# Patient Record
Sex: Female | Born: 1960 | Race: White | Hispanic: No | Marital: Married | Smoking: Never smoker
Health system: Southern US, Community
[De-identification: ages and names within clinical notes are randomized; demographics above are authoritative.]

## PROBLEM LIST (undated history)

## (undated) ENCOUNTER — Ambulatory Visit: Admission: EM | Disposition: A | Discharge status: Home / Self Care | Payer: 59

## (undated) ENCOUNTER — Ambulatory Visit

## (undated) DIAGNOSIS — R112 Nausea with vomiting, unspecified: Secondary | ICD-10-CM

## (undated) DIAGNOSIS — Z87442 Personal history of urinary calculi: Secondary | ICD-10-CM

## (undated) DIAGNOSIS — Z9889 Other specified postprocedural states: Secondary | ICD-10-CM

## (undated) DIAGNOSIS — R7303 Prediabetes: Secondary | ICD-10-CM

## (undated) DIAGNOSIS — T884XXA Failed or difficult intubation, initial encounter: Secondary | ICD-10-CM

## (undated) HISTORY — PX: DIAGNOSTIC LAPAROSCOPY: SUR761

---

## 1988-11-02 HISTORY — PX: NASAL SEPTUM SURGERY: SHX37

## 1998-06-26 ENCOUNTER — Other Ambulatory Visit: Admission: RE | Admit: 1998-06-26 | Discharge: 1998-06-26 | Payer: Self-pay | Admitting: Obstetrics and Gynecology

## 1999-06-18 ENCOUNTER — Other Ambulatory Visit: Admission: RE | Admit: 1999-06-18 | Discharge: 1999-06-18 | Payer: Self-pay | Admitting: Obstetrics and Gynecology

## 2000-07-28 ENCOUNTER — Other Ambulatory Visit: Admission: RE | Admit: 2000-07-28 | Discharge: 2000-07-28 | Payer: Self-pay | Admitting: Obstetrics and Gynecology

## 2001-08-30 ENCOUNTER — Other Ambulatory Visit: Admission: RE | Admit: 2001-08-30 | Discharge: 2001-08-30 | Payer: Self-pay | Admitting: Obstetrics and Gynecology

## 2002-11-01 ENCOUNTER — Other Ambulatory Visit: Admission: RE | Admit: 2002-11-01 | Discharge: 2002-11-01 | Payer: Self-pay | Admitting: Obstetrics and Gynecology

## 2003-11-26 ENCOUNTER — Other Ambulatory Visit: Admission: RE | Admit: 2003-11-26 | Discharge: 2003-11-26 | Payer: Self-pay | Admitting: Obstetrics and Gynecology

## 2005-02-04 ENCOUNTER — Other Ambulatory Visit: Admission: RE | Admit: 2005-02-04 | Discharge: 2005-02-04 | Payer: Self-pay | Admitting: Obstetrics and Gynecology

## 2006-02-25 ENCOUNTER — Other Ambulatory Visit: Admission: RE | Admit: 2006-02-25 | Discharge: 2006-02-25 | Payer: Self-pay | Admitting: Obstetrics and Gynecology

## 2008-11-02 DIAGNOSIS — T884XXA Failed or difficult intubation, initial encounter: Secondary | ICD-10-CM

## 2008-11-02 HISTORY — DX: Failed or difficult intubation, initial encounter: T88.4XXA

## 2008-11-02 HISTORY — PX: ABDOMINAL HYSTERECTOMY: SHX81

## 2009-01-14 ENCOUNTER — Encounter (INDEPENDENT_AMBULATORY_CARE_PROVIDER_SITE_OTHER): Payer: Self-pay | Admitting: Obstetrics and Gynecology

## 2009-01-14 ENCOUNTER — Inpatient Hospital Stay (HOSPITAL_COMMUNITY): Admission: RE | Admit: 2009-01-14 | Discharge: 2009-01-16 | Payer: Self-pay | Admitting: Obstetrics and Gynecology

## 2011-02-12 LAB — CBC
Hemoglobin: 10.9 g/dL — ABNORMAL LOW (ref 12.0–15.0)
Hemoglobin: 14 g/dL (ref 12.0–15.0)
MCHC: 34.9 g/dL (ref 30.0–36.0)
Platelets: 275 10*3/uL (ref 150–400)
RBC: 4.63 MIL/uL (ref 3.87–5.11)
RDW: 12.3 % (ref 11.5–15.5)
WBC: 8.8 10*3/uL (ref 4.0–10.5)

## 2011-02-12 LAB — HCG, SERUM, QUALITATIVE: Preg, Serum: NEGATIVE

## 2011-03-17 NOTE — Discharge Summary (Signed)
NAMEBAYLOR, TEEGARDEN NO.:  [id number]   MEDICAL RECORD NO.:  [id number]          PATIENT TYPE:  INP   LOCATION:  9302                          FACILITY:  [facility]   PHYSICIAN:  Juluis Mire, M.D.   DATE OF BIRTH:  Mar 25, 1961   DATE OF ADMISSION:  01/14/2009  DATE OF DISCHARGE:  01/16/2009                               DISCHARGE SUMMARY   j   ADMITTING DIAGNOSES:  1. 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  2. Known severe pelvic endometriosis with adhesions.   POSTOPERATIVE DIAGNOSES:  1. 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  2. Known severe pelvic endometriosis with adhesions.   OPERATIVE PROCEDURE:  Total abdominal hysterectomy with bilateral  salpingo-oophorectomy and cystoscopy.   For complete history and physical, please see dictated note.   COURSE IN THE HOSPITAL:  The patient underwent above-noted surgery.  She  did have extensive pelvic adhesions.  Pathology was all benign.  She did  have significant adenomyosis.  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  Postop hemoglobin 10.9,  discharged home on second postop day.  At that time, she was tolerating  regular diet and ambulating without difficulty.  She had normal voiding  function.  She was afebrile with stable vital signs.  Abdomen was soft.  Bowel sounds were active.  Low-transverse incision was intact.  No  active bleeding.   In terms of complications, none were encountered during her stay in the  hospital.  The patient discharged home in stable condition.   DISPOSITION:  Routine postop instructions were given.  She is to avoid  heavy lifting, vaginal entrance, driving a car.  She is to watch for  signs of infection.  Signs of obstruction in terms of nausea, vomiting.  She will call with increasing abdominal or pelvic pain.  She will call  with any active vaginal bleeding.  Also instruction signs and symptoms  of deep venous thrombosis and  pulmonary embolus.  Discharged home on  [place].  She will come back to the office on Friday for removal of  staples.      Juluis Mire, M.D.  Electronically Signed     JSM/MEDQ  D:  01/16/2009  T:  01/16/2009  Job:  [id number]

## 2011-03-17 NOTE — Op Note (Signed)
NAMEKRISTAL, [name] NO.:  [id number]   MEDICAL RECORD NO.:  [id number]          PATIENT TYPE:  INP   LOCATION:  9302                          FACILITY:  [facility]   PHYSICIAN:  Juluis Mire, M.D.   DATE OF BIRTH:  04-Nov-1960   DATE OF PROCEDURE:  01/14/2009  DATE OF DISCHARGE:                               OPERATIVE REPORT   PREOPERATIVE DIAGNOSES:  Cystic enlargement on the left ovary.  Known  pelvic adhesions, endometriosis.   POSTOPERATIVE DIAGNOSES:  Cystic enlargement on the left ovary.  Known  pelvic adhesions, endometriosis.   PROCEDURE:  Total abdominal hysterectomy with bilateral salpingo-  oophorectomy.  Lysis of adhesions.  Cystoscopy.   SURGEON:  Juluis Mire, MD   ASSISTANT:  Duke Salvia. Marcelle Overlie, MD   ANESTHESIA:  General endotracheal.   ESTIMATED BLOOD LOSS:  500 mL.   PACKS AND DRAINS:  None.   INTRAOPERATIVE BLOOD PLACED:  None.   COMPLICATIONS:  None.   INDICATIONS:  Dictated in history and physical.   PROCEDURE:  The patient was taken to the OR, placed in supine position.  After satisfactory level of general endotracheal anesthesia was  obtained, the abdomen, perineum, vagina were prepped out with Betadine,  and draped to sterile field.  A prior low transverse skin incision was  identified and excised.  She did have a dark nevi below the incision,  this was excised and sent for pathology.  The incision was extended  through subcutaneous tissue.  Anterior rectus fascia was entered sharply  and incision fashioned laterally.  Fascia taken off the muscle  superiorly inferiorly.  Rectus muscles were separated in the midline.  Anterior peritoneum was entered sharply and incision of the perineum  extended both superiorly and inferiorly.  A O'Connor-O'Sullivan  retractor was put in place.  Bowel contents were packed superiorly out  of pelvis.  The sigmoid colon was adherent to the left pelvic sidewall  on the top of the uterus.  This was  taken down using the sharp  dissection.  The colon was then displaced out of the pelvis.  There was  a large cystic enlarged on the left side.  It was easily freed up and  looked like it was a hydrosalpinx.  There was a simple clear cyst.  It  was sent off for pathology.  At this point in time, we are using blunt  sharp dissection developed the cul-de-sac.  We then went to the right  side.  It was hard to identify any obvious structures.  We identified,  what we felt was a round ligament.  We clamped, cut, and suture ligated  it, and developed the right retroperitoneal space.  The right ovary was  small and atrophic.  It was elevated up.  We felt like we could feel the  ureter on the right side, isolated the ovarian vasculature above this,  clamped, cut it, and doubly ligated with 2 free ties of 0 Vicryl.  We  then dissected this up to the uterus and the ovary was cut away from the  uterus and sent to  pathology.  We then went to the left side, it was  hard to identify any ovary on this side, but eventually developed, found  what we felt was a small piece of the ovary, it was elevated.  Again, we  felt we could feel the ureter.  The ovarian vasculature was isolated  above this, clamped, cut, and doubly ligated with 2 free ties of 0  Vicryl.  The ovary was cut away from the uterus and sent to pathology.  We then developed the bladder flap.  Uterine vessels were clamped, cut,  and suture ligated with 0 Vicryl.  The uterus actually started shredding  at this point, it was very small, but extremely friable.  We continued  the clamp, cut, and suture ligated techniques went down the uterine  sidewall.  We got a bleeder on the left side.  This was brought to  control with several vascular clips.  We then identified what looked  like the end of the cervix, clamped across this and cut it, and what was  left at the cervical stump was passed off the operative field.  Again,  the uterus came out in  small pieces.  We then identified the vaginal  angles.  These were ligated with suture ligatures of 0 Vicryl.  Vaginal  mucosa was closed with interrupted figure-of-eight of 0 Vicryl.  We had  good hemostasis.  We irrigated the pelvis.  There was no active  bleeding.  We looked for the appendix could not find it.  Then, we  irrigated had good hemostasis.  At this point in time, packs removed  along with a O'Connor-O'Sullivan retractor again we tried to find the  appendix, could not see it in the cecum.  It did not look to be  retrocecal either.  Perineum muscle closed with a suture of 2-0 Vicryl.  Fascia closed with a suture of 0 PDS.  Skin was closed with staples and  Steri-Strips.  At this point in time, the patient was placed in dorsal  lithotomy position.  Cystoscopy was performed.  There was no evidence of  any injury to the bladder.  Both ureteral orifices were identified noted  to be spilling large amounts of indigo carmine stained urine.  Therefore, ureters appeared to be intact.  Foley was placed back  straight drain.  The patient taken out of dorsal lithotomy position,  once alert and extubated, transferred to recovery room in good  condition.  Sponge, instrument, and needle count reported as correct by  circulating nurse x2.      Juluis Mire, M.D.  Electronically Signed     JSM/MEDQ  D:  01/14/2009  T:  01/14/2009  Job:  [id number]

## 2011-03-17 NOTE — H&P (Signed)
NAME:  [name], [name] NO.:  [id number]   MEDICAL RECORD NO.:  [id number]          PATIENT TYPE:  AMB   LOCATION:  SDC                           FACILITY:  [facility]   PHYSICIAN:  Juluis Mire, M.D.   DATE OF BIRTH:  October 05, 1961   DATE OF ADMISSION:  DATE OF DISCHARGE:                              HISTORY & PHYSICAL   The patient is a 50 year old gravida 1, para 0, abortus one, female who  presents for total abdominal hysterectomy with bilateral salpingo-  oophorectomy.   The patient known to have severe pelvic adhesions secondary to  endometriosis.  She has had persistent cystic enlargement of the left  ovary.  It measures approximately 6.8 x 4.8 is simple in nature.  No  free fluid was noted.  Because of persistent cystic enlargement and  associated pelvic pain and age, the patient now presents for definitive  surgery.   The patient's past history is significant in that in 1981, she had a  cyst removed via laparotomy.  Evidently was seen, have severe  endometriosis with adhesions at that point in time.  Because of  recurrent pelvic pain.  She underwent laparoscopic evaluation on November 20, 1993, with ovarian cystectomy that turned out to be an endometrioma.  She had conceived with in vitro fertilization, unfortunately loss of  pregnancy.  Again, she is known to have pelvic adhesions with bleeding.  This is an ovarian entrapment cyst.  We are going to proceed with  exploratory surgery.   In terms of allergies, the patient has no known drug allergies.   MEDICATIONS:  Depo-Provera.   PAST MEDICAL HISTORY:  Usual childhood diseases.  Does have a history of  kidney stones and associated urinary tract infections.   PAST SURGICAL HISTORY:  She had exploratory laparotomy with removal of  left endometrioma in 1981.  She had a hysteroscopy in 1995.  At that  time also had laparoscopic removal of cyst from the left ovary.  She had  a hysteroscopy in July 1995.  Nose  surgery in 1992.  Conization of  cervix in 1987.  Previous D and C for nonviable pregnancy in 1995.   FAMILY HISTORY:  Noncontributory.   SOCIAL HISTORY:  No tobacco or alcohol use.   REVIEW OF SYSTEMS:  Noncontributory.   PHYSICAL EXAMINATION:  VITAL SIGNS:  The patient is afebrile, stable  vital signs.  HEENT:  The patient is normocephalic.  Pupils equal, round, reactive to  light, and accommodation.  Extraocular movements were intact.  Sclerae  and conjunctivae are clear.  Oropharynx is clear.  NECK:  Without thyromegaly.  BREASTS:  No discrete masses.  LUNGS:  Clear.  CARDIOVASCULAR:  Regular rate without murmurs or gallops.  ABDOMEN:  Benign.  No mass, organomegaly, or tenderness.  PELVIC:  Normal external genitalia.  Vaginal mucosa is clear.  Cervix  unremarkable.  Uterus is somewhat fixed.  Adnexa unremarkable.  I do not  feel the cyst on pelvic exam.  EXTREMITIES:  Trace edema.  NEUROLOGIC:  Grossly normal limits.   IMPRESSION:  1. It is known pelvic  adhesions secondary to severe endometriosis.  2. Persistent cystic enlargement of the ovary.   PLAN:  The patient will undergo a total abdominal hysterectomy and  bilateral salpingo-oophorectomy.  The risks of surgery have been  discussed including the risk of infection.  The risk of hemorrhage could  require transfusion with the risk of AIDS or hepatitis.  Risk of injury  to adjacent organs including bladder, bowel, or ureters that could  require further exploratory surgery.  Risk of deep venous thrombosis and  pulmonary embolus.  The patient expressed understanding the indications  and risk.      Juluis Mire, M.D.  Electronically Signed     JSM/MEDQ  D:  01/14/2009  T:  01/14/2009  Job:  [id number]

## 2014-05-06 ENCOUNTER — Encounter (HOSPITAL_COMMUNITY): Payer: Self-pay | Admitting: Emergency Medicine

## 2014-05-06 ENCOUNTER — Emergency Department (HOSPITAL_COMMUNITY): Payer: 59

## 2014-05-06 ENCOUNTER — Emergency Department (HOSPITAL_COMMUNITY)
Admission: EM | Admit: 2014-05-06 | Discharge: 2014-05-07 | Disposition: A | Discharge status: Home / Self Care | Payer: 59 | Attending: Emergency Medicine | Admitting: Emergency Medicine

## 2014-05-06 DIAGNOSIS — Z23 Encounter for immunization: Secondary | ICD-10-CM | POA: Insufficient documentation

## 2014-05-06 DIAGNOSIS — T148XXA Other injury of unspecified body region, initial encounter: Secondary | ICD-10-CM

## 2014-05-06 DIAGNOSIS — S62650B Nondisplaced fracture of medial phalanx of right index finger, initial encounter for open fracture: Secondary | ICD-10-CM

## 2014-05-06 DIAGNOSIS — S61209A Unspecified open wound of unspecified finger without damage to nail, initial encounter: Secondary | ICD-10-CM | POA: Insufficient documentation

## 2014-05-06 DIAGNOSIS — S91009A Unspecified open wound, unspecified ankle, initial encounter: Secondary | ICD-10-CM

## 2014-05-06 DIAGNOSIS — S81009A Unspecified open wound, unspecified knee, initial encounter: Secondary | ICD-10-CM | POA: Insufficient documentation

## 2014-05-06 DIAGNOSIS — S61218A Laceration without foreign body of other finger without damage to nail, initial encounter: Secondary | ICD-10-CM

## 2014-05-06 DIAGNOSIS — IMO0002 Reserved for concepts with insufficient information to code with codable children: Secondary | ICD-10-CM | POA: Insufficient documentation

## 2014-05-06 DIAGNOSIS — Y929 Unspecified place or not applicable: Secondary | ICD-10-CM | POA: Insufficient documentation

## 2014-05-06 DIAGNOSIS — S81809A Unspecified open wound, unspecified lower leg, initial encounter: Secondary | ICD-10-CM

## 2014-05-06 DIAGNOSIS — Y9389 Activity, other specified: Secondary | ICD-10-CM | POA: Insufficient documentation

## 2014-05-06 DIAGNOSIS — S81811A Laceration without foreign body, right lower leg, initial encounter: Secondary | ICD-10-CM

## 2014-05-06 DIAGNOSIS — W540XXA Bitten by dog, initial encounter: Secondary | ICD-10-CM | POA: Insufficient documentation

## 2014-05-06 DIAGNOSIS — S61216A Laceration without foreign body of right little finger without damage to nail, initial encounter: Secondary | ICD-10-CM

## 2014-05-06 DIAGNOSIS — Z79899 Other long term (current) drug therapy: Secondary | ICD-10-CM | POA: Insufficient documentation

## 2014-05-06 NOTE — ED Notes (Signed)
Pt states she was bitten by her dog while trying to break up a dog fight.  Pt is addiment that all of her dogs are up to date on their rabies vaccinations

## 2014-05-06 NOTE — ED Notes (Signed)
The pt is c/o pain in her rt lower leg and pain in her rt index finger. From a dog bite.  The dogs are hers.  She was attempting to break up a fight and she was bitten

## 2014-05-07 ENCOUNTER — Emergency Department (HOSPITAL_COMMUNITY): Payer: 59

## 2014-05-07 MED ORDER — HYDROCODONE-ACETAMINOPHEN 5-325 MG PO TABS
2.0000 | ORAL_TABLET | Freq: Once | ORAL | Status: AC
  Administered 2014-05-07: 2 via ORAL
  Filled 2014-05-07: qty 2

## 2014-05-07 MED ORDER — TETANUS-DIPHTH-ACELL PERTUSSIS 5-2.5-18.5 LF-MCG/0.5 IM SUSP
0.5000 mL | Freq: Once | INTRAMUSCULAR | Status: AC
  Administered 2014-05-07: 0.5 mL via INTRAMUSCULAR
  Filled 2014-05-07: qty 0.5

## 2014-05-07 MED ORDER — AMOXICILLIN-POT CLAVULANATE 875-125 MG PO TABS: 1.0000 | ORAL_TABLET | Freq: Two times a day (BID) | ORAL | Status: DC

## 2014-05-07 MED ORDER — SODIUM CHLORIDE 0.9 % IV SOLN
3.0000 g | Freq: Once | INTRAVENOUS | Status: AC
  Administered 2014-05-07: 3 g via INTRAVENOUS
  Filled 2014-05-07: qty 3

## 2014-05-07 NOTE — ED Notes (Signed)
Returned from xray

## 2014-05-07 NOTE — ED Notes (Signed)
Transported to xray 

## 2014-05-07 NOTE — Discharge Instructions (Signed)
Take antibiotic for full dose  Use RICE method - see below Return to the emergency department if you develop any changing/worsening condition, drainage, spreading redness/swelling, or any other concerns (please read additional information regarding your condition below)   Animal Bite An animal bite can result in a scratch on the skin, deep open cut, puncture of the skin, crush injury, or tearing away of the skin or a body part. Dogs are responsible for most animal bites. Children are bitten more often than adults. An animal bite can range from very mild to more serious. A small bite from your house pet is no cause for alarm. However, some animal bites can become infected or injure a bone or other tissue. You must seek medical care if:  The skin is broken and bleeding does not slow down or stop after 15 minutes.  The puncture is deep and difficult to clean (such as a cat bite).  Pain, warmth, redness, or pus develops around the wound.  The bite is from a stray animal or rodent. There may be a risk of rabies infection.  The bite is from a snake, raccoon, skunk, fox, coyote, or bat. There may be a risk of rabies infection.  The person bitten has a chronic illness such as diabetes, liver disease, or cancer, or the person takes medicine that lowers the immune system.  There is concern about the location and severity of the bite. It is important to clean and protect an animal bite wound right away to prevent infection. Follow these steps:  Clean the wound with plenty of water and soap.  Apply an antibiotic cream.  Apply gentle pressure over the wound with a clean towel or gauze to slow or stop bleeding.  Elevate the affected area above the heart to help stop any bleeding.  Seek medical care. Getting medical care within 8 hours of the animal bite leads to the best possible outcome. DIAGNOSIS  Your caregiver will most likely:  Take a detailed history of the animal and the bite  injury.  Perform a wound exam.  Take your medical history. Blood tests or X-rays may be performed. Sometimes, infected bite wounds are cultured and sent to a lab to identify the infectious bacteria.  TREATMENT  Medical treatment will depend on the location and type of animal bite as well as the patient's medical history. Treatment may include:  Wound care, such as cleaning and flushing the wound with saline solution, bandaging, and elevating the affected area.  Antibiotics.  Tetanus immunization.  Rabies immunization.  Leaving the wound open to heal. This is often done with animal bites, due to the high risk of infection. However, in certain cases, wound closure with stitches, wound adhesive, skin adhesive strips, or staples may be used. Infected bites that are left untreated may require intravenous (IV) antibiotics and surgical treatment in the hospital. HOME CARE INSTRUCTIONS  Follow your caregiver's instructions for wound care.  Take all medicines as directed.  If your caregiver prescribes antibiotics, take them as directed. Finish them even if you start to feel better.  Follow up with your caregiver for further exams or immunizations as directed. You may need a tetanus shot if:  You cannot remember when you had your last tetanus shot.  You have never had a tetanus shot.  The injury broke your skin. If you get a tetanus shot, your arm may swell, get red, and feel warm to the touch. This is common and not a problem. If you need  a tetanus shot and you choose not to have one, there is a rare chance of getting tetanus. Sickness from tetanus can be serious. SEEK MEDICAL CARE IF:  You notice warmth, redness, soreness, swelling, pus discharge, or a bad smell coming from the wound.  You have a red line on the skin coming from the wound.  You have a fever, chills, or a general ill feeling.  You have nausea or vomiting.  You have continued or worsening pain.  You have  trouble moving the injured part.  You have other questions or concerns. MAKE SURE YOU:  Understand these instructions.  Will watch your condition.  Will get help right away if you are not doing well or get worse. Document Released: 07/07/2011 Document Revised: 01/11/2012 Document Reviewed: 07/07/2011 [facility] Patient Information 2015 [place], [place]. This information is not intended to replace advice given to you by your health care provider. Make sure you discuss any questions you have with your health care provider.  Laceration Care, Adult A laceration is a cut or lesion that goes through all layers of the skin and into the tissue just beneath the skin. TREATMENT  Some lacerations may not require closure. Some lacerations may not be able to be closed due to an increased risk of infection. It is important to see your caregiver as soon as possible after an injury to minimize the risk of infection and maximize the opportunity for successful closure. If closure is appropriate, pain medicines may be given, if needed. The wound will be cleaned to help prevent infection. Your caregiver will use stitches (sutures), staples, wound glue (adhesive), or skin adhesive strips to repair the laceration. These tools bring the skin edges together to allow for faster healing and a better cosmetic outcome. However, all wounds will heal with a scar. Once the wound has healed, scarring can be minimized by covering the wound with sunscreen during the day for 1 full year. HOME CARE INSTRUCTIONS  For sutures or staples:  Keep the wound clean and dry.  If you were given a bandage (dressing), you should change it at least once a day. Also, change the dressing if it becomes wet or dirty, or as directed by your caregiver.  Wash the wound with soap and water 2 times a day. Rinse the wound off with water to remove all soap. Pat the wound dry with a clean towel.  After cleaning, apply a thin layer of the antibiotic  ointment as recommended by your caregiver. This will help prevent infection and keep the dressing from sticking.  You may shower as usual after the first 24 hours. Do not soak the wound in water until the sutures are removed.  Only take over-the-counter or prescription medicines for pain, discomfort, or fever as directed by your caregiver.  Get your sutures or staples removed as directed by your caregiver. For skin adhesive strips:  Keep the wound clean and dry.  Do not get the skin adhesive strips wet. You may bathe carefully, using caution to keep the wound dry.  If the wound gets wet, pat it dry with a clean towel.  Skin adhesive strips will fall off on their own. You may trim the strips as the wound heals. Do not remove skin adhesive strips that are still stuck to the wound. They will fall off in time. For wound adhesive:  You may briefly wet your wound in the shower or bath. Do not soak or scrub the wound. Do not swim. Avoid periods of  heavy perspiration until the skin adhesive has fallen off on its own. After showering or bathing, gently pat the wound dry with a clean towel.  Do not apply liquid medicine, cream medicine, or ointment medicine to your wound while the skin adhesive is in place. This may loosen the film before your wound is healed.  If a dressing is placed over the wound, be careful not to apply tape directly over the skin adhesive. This may cause the adhesive to be pulled off before the wound is healed.  Avoid prolonged exposure to sunlight or tanning lamps while the skin adhesive is in place. Exposure to ultraviolet light in the first year will darken the scar.  The skin adhesive will usually remain in place for 5 to 10 days, then naturally fall off the skin. Do not pick at the adhesive film. You may need a tetanus shot if:  You cannot remember when you had your last tetanus shot.  You have never had a tetanus shot. If you get a tetanus shot, your arm may swell,  get red, and feel warm to the touch. This is common and not a problem. If you need a tetanus shot and you choose not to have one, there is a rare chance of getting tetanus. Sickness from tetanus can be serious. SEEK MEDICAL CARE IF:   You have redness, swelling, or increasing pain in the wound.  You see a red line that goes away from the wound.  You have yellowish-white fluid (pus) coming from the wound.  You have a fever.  You notice a bad smell coming from the wound or dressing.  Your wound breaks open before or after sutures have been removed.  You notice something coming out of the wound such as wood or glass.  Your wound is on your hand or foot and you cannot move a finger or toe. SEEK IMMEDIATE MEDICAL CARE IF:   Your pain is not controlled with prescribed medicine.  You have severe swelling around the wound causing pain and numbness or a change in color in your arm, hand, leg, or foot.  Your wound splits open and starts bleeding.  You have worsening numbness, weakness, or loss of function of any joint around or beyond the wound.  You develop painful lumps near the wound or on the skin anywhere on your body. MAKE SURE YOU:   Understand these instructions.  Will watch your condition.  Will get help right away if you are not doing well or get worse. Document Released: 10/19/2005 Document Revised: 01/11/2012 Document Reviewed: 04/14/2011 [facility] Patient Information 2015 [place], [place]. This information is not intended to replace advice given to you by your health care provider. Make sure you discuss any questions you have with your health care provider.  Finger Fracture Fractures of fingers are breaks in the bones of the fingers. There are many types of fractures. There are different ways of treating these fractures. Your health care provider will discuss the best way to treat your fracture. CAUSES Traumatic injury is the main cause of broken fingers. These  include: Injuries while playing sports. Workplace injuries. Falls. RISK FACTORS Activities that can increase your risk of finger fractures include: Sports. Workplace activities that involve machinery. A condition called osteoporosis, which can make your bones less dense and cause them to fracture more easily. SIGNS AND SYMPTOMS The main symptoms of a broken finger are pain and swelling within 15 minutes after the injury. Other symptoms include: Bruising of your finger. Stiffness  of your finger. Numbness of your finger. Exposed bones (compound fracture) if the fracture is severe. DIAGNOSIS  The best way to diagnose a broken bone is with X-ray imaging. Additionally, your health care provider will use this X-ray image to evaluate the position of the broken finger bones.  TREATMENT  Finger fractures can be treated with:  Nonreduction--This means the bones are in place. The finger is splinted without changing the positions of the bone pieces. The splint is usually left on for about a week to 10 days. This will depend on your fracture and what your health care provider thinks. Closed reduction--The bones are put back into position without using surgery. The finger is then splinted. Open reduction and internal fixation--The fracture site is opened. Then the bone pieces are fixed into place with pins or some type of hardware. This is seldom required. It depends on the severity of the fracture. HOME CARE INSTRUCTIONS  Follow your health care provider's instructions regarding activities, exercises, and physical therapy. Only take over-the-counter or prescription medicines for pain, discomfort, or fever as directed by your health care provider. SEEK MEDICAL CARE IF: You have pain or swelling that limits the motion or use of your fingers. SEEK IMMEDIATE MEDICAL CARE IF:  Your finger becomes numb. MAKE SURE YOU:  Understand these instructions. Will watch your condition. Will get help right away if  you are not doing well or get worse. Document Released: 01/31/2001 Document Revised: 08/09/2013 Document Reviewed: 05/31/2013 [facility] Patient Information 2015 [place], [place]. This information is not intended to replace advice given to you by your health care provider. Make sure you discuss any questions you have with your health care provider.  RICE: Routine Care for Injuries The routine care of many injuries includes Rest, Ice, Compression, and Elevation (RICE). HOME CARE INSTRUCTIONS Rest is needed to allow your body to heal. Routine activities can usually be resumed when comfortable. Injured tendons and bones can take up to 6 weeks to heal. Tendons are the cord-like structures that attach muscle to bone. Ice following an injury helps keep the swelling down and reduces pain. Put ice in a plastic bag. Place a towel between your skin and the bag. Leave the ice on for 15-20 minutes, 3-4 times a day, or as directed by your health care provider. Do this while awake, for the first 24 to 48 hours. After that, continue as directed by your caregiver. Compression helps keep swelling down. It also gives support and helps with discomfort. If an elastic bandage has been applied, it should be removed and reapplied every 3 to 4 hours. It should not be applied tightly, but firmly enough to keep swelling down. Watch fingers or toes for swelling, bluish discoloration, coldness, numbness, or excessive pain. If any of these problems occur, remove the bandage and reapply loosely. Contact your caregiver if these problems continue. Elevation helps reduce swelling and decreases pain. With extremities, such as the arms, hands, legs, and feet, the injured area should be placed near or above the level of the heart, if possible. SEEK IMMEDIATE MEDICAL CARE IF: You have persistent pain and swelling. You develop redness, numbness, or unexpected weakness. Your symptoms are getting worse rather than improving after several  days. These symptoms may indicate that further evaluation or further X-rays are needed. Sometimes, X-rays may not show a small broken bone (fracture) until 1 week or 10 days later. Make a follow-up appointment with your caregiver. Ask when your X-ray results will be ready. Make sure you  get your X-ray results. Document Released: 01/31/2001 Document Revised: 10/24/2013 Document Reviewed: 03/20/2011 [facility] [place] Patient Information 2015 [place], [place]. This information is not intended to replace advice given to you by your health care provider. Make sure you discuss any questions you have with your health care provider.

## 2014-05-07 NOTE — ED Notes (Signed)
Patient and family verbalized understanding of discharge instructions.  Patient with no s/sx of adverse reaction to antibiotic.  Dressing applied to finger by pa and dressing to legs reinforced prior to discharge.

## 2014-05-07 NOTE — ED Provider Notes (Signed)
CSN: [id number]     Arrival date & time 05/06/14  2142 History   First MD Initiated Contact with Patient 05/06/14 2333     Chief Complaint  Patient presents with  . Animal Bite   HPI  [name] is a 53 y.o. female with no PMH who presents to the ED for evaluation of an animal bite. History was provided by the patient. This evening, patient was trying to break up a dog fight when she was bitten in the right hand and right leg. Dog's are owned by the patient and their vaccinations are up to date (rabies). Patient states she felt a "crunch" in her index finger when her dog bit down. Complains of a constant throbbing pain to her index finger, which is worse with movement. No numbness/tingling or loss of sensation. Has some difficulty moving the tip of her index finger. Patient washed her wounds PTA. Denies any known foreign bodies. Unclear if tetanus is up to date. No difficulty with ambulation. Nothing provided for pain PTA.    History reviewed. No pertinent past medical history. History reviewed. No pertinent past surgical history. No family history on file. History  Substance Use Topics  . Smoking status: Never Smoker   . Smokeless tobacco: Not on file  . Alcohol Use: Yes   OB History   Grav Para Term Preterm Abortions TAB SAB Ect Mult Living                  Review of Systems  Constitutional: Negative for fever, chills, activity change, appetite change and fatigue.  Musculoskeletal: Positive for arthralgias (right index finger) and joint swelling (right index finger). Negative for gait problem.  Skin: Positive for wound. Negative for color change.  Neurological: Positive for weakness. Negative for numbness.    Allergies  Review of patient's allergies indicates no known allergies.  Home Medications   Prior to Admission medications   Medication Sig Start Date End Date Taking? Authorizing Provider  MAGNESIUM PO Take 1 tablet by mouth daily.   Yes Historical Provider, MD    Probiotic Product (PROBIOTIC PO) Take 1 tablet by mouth daily.   Yes Historical Provider, MD  amoxicillin-clavulanate (AUGMENTIN) 875-125 MG per tablet Take 1 tablet by mouth every 12 (twelve) hours. 05/07/14   Jillyn LedgerJessica K Reneshia Zuccaro, PA-C   BP 101/64  Pulse 66  Temp(Src) 97.6 F (36.4 C) (Oral)  Resp 20  Ht 5\' 2"  (1.575 m)  Wt 165 lb 8 oz (75.07 kg)  BMI 30.26 kg/m2  SpO2 99%  Filed Vitals:   05/06/14 2146 05/07/14 0054  BP: 140/85 101/64  Pulse: 85 66  Temp: 97.6 F (36.4 C)   TempSrc: Oral   Resp: 14 20  Height: 5\' 2"  (1.575 m)   Weight: 165 lb 8 oz (75.07 kg)   SpO2: 96% 99%    Physical Exam  Nursing note and vitals reviewed. Constitutional: She is oriented to person, place, and time. She appears well-developed and well-nourished. No distress.  HENT:  Head: Normocephalic and atraumatic.  Right Ear: External ear normal.  Left Ear: External ear normal.  Mouth/Throat: Oropharynx is clear and moist.  Eyes: Conjunctivae are normal. Right eye exhibits no discharge. Left eye exhibits no discharge.  Neck: Normal range of motion. Neck supple.  Cardiovascular: Normal rate.   Radial and dorsalis pedis pulses present and equal bilaterally  Pulmonary/Chest: Effort normal.  Abdominal: Soft.  Musculoskeletal: Normal range of motion. She exhibits edema and tenderness.  Hands:      Legs: Tenderness to palpation to the distal right index finger with mild associated edema. Two 1 cm open horizontal linear lacerations to the distal middle phalanx on the dorsal and palmar side of the right index finger. No active bleeding or drainage. ROM of the DIP joint limited on the right index finger. Full ROM of the right PIP index finger. ROM intact in the LE. Patient able to ambulate without difficulty or ataxia  Neurological: She is alert and oriented to person, place, and time.  Sensation intact in the UE and LE  Skin: Skin is warm and dry. She is not diaphoretic.  3-4 puncture wounds to the  palmar side of the right pinkie finger with no limitations in ROM. No active bleeding. 2-3 puncture wounds to the medial and lateral malleolus on the right leg with no active bleeding. 4 open 1 cm elliptical wounds to the anterior proximal lower leg with no active drainage or bleeding. No palpable foreign bodies throughout.     ED Course  Procedures (including critical care time) Labs Review Labs Reviewed - No data to display  Imaging Review Dg Tibia/fibula Right  05/07/2014   CLINICAL DATA:  Dog bite with swelling to the mid anterior tib-fib area.  EXAM: RIGHT TIBIA AND FIBULA - 2 VIEW  COMPARISON:  None.  FINDINGS: Soft tissue laceration anterior and medial to the proximal tibia. No radiopaque soft tissue foreign bodies. Underlying bones appear intact.  IMPRESSION: Soft tissue injury.  No acute bony abnormalities.   Electronically Signed   By: Burman NievesWilliam  Stevens M.D.   On: 05/07/2014 00:41   Dg Ankle Complete Right  05/07/2014   CLINICAL DATA:  Dog bite to ankle.  Ankle pain and swelling.  EXAM: RIGHT ANKLE - COMPLETE 3+ VIEW  COMPARISON:  None.  FINDINGS: There is no evidence of fracture, dislocation, or joint effusion. There is no evidence of arthropathy or other focal bone abnormality. Soft tissues are unremarkable. No evidence of radiopaque foreign body.  IMPRESSION: Negative.   Electronically Signed   By: Myles RosenthalJohn  Stahl M.D.   On: 05/07/2014 00:41   Dg Finger Index Right  05/06/2014   CLINICAL DATA:  Dog bite.  Index finger pain and swelling.  EXAM: RIGHT INDEX FINGER 2+V  COMPARISON:  None.  FINDINGS: Nondisplaced fracture of the middle phalanx of the index finger is seen. No evidence of dislocation. Soft tissue swelling and laceration noted, however there is no evidence of radiopaque foreign body.  IMPRESSION: Nondisplaced fracture of the middle phalanx. No evidence of radiopaque foreign body.   Electronically Signed   By: Myles RosenthalJohn  Stahl M.D.   On: 05/06/2014 22:43   Dg Finger Little  Right  05/07/2014   CLINICAL DATA:  Dog bite, finger swelling.  EXAM: RIGHT LITTLE FINGER 2+V  COMPARISON:  Right index finger radiograph May 06, 2014  FINDINGS: Oblique nondisplaced fracture of a middle phalanx seen only on lateral radiograph may reflect patient's known second digit fracture. The fourth and fifth digits appear intact without fracture deformity or dislocation on the remaining two views. No destructive bony lesions. Soft tissue planes are nonsuspicious.  IMPRESSION: Middle phalanx nondisplaced fracture seen on lateral radiograph may reflect patient's known second digit fracture.   Electronically Signed   By: Awilda Metroourtnay  Bloomer   On: 05/07/2014 00:42     EKG Interpretation None      MDM   [name] is a 53 y.o. female with no PMH who presents to  the ED for evaluation of an animal bite. Rabies up to date per patient. Patient found to have a non-displaced fracture of the middle phalanx on the right index finger with an overlying laceration. All wound extensively irrigated in the ED and antibiotic ointment applied. Finger splinted. No lacerations were repaired. Patient given IV Unasyn and will be discharged home on Augmentin. Tetanus booster given. Wound care discussed with patient. Hand surgery consulted and will follow patient in clinic this week. Return precautions, discharge instructions, and follow-up was discussed with the patient before discharge.   Consults  Spoke with Dr. Melvyn Novas who will follow-up with the patient in clinic this week. Unasyn IV and Augmentin OP. Splint finger.    New Prescriptions   AMOXICILLIN-CLAVULANATE (AUGMENTIN) 875-125 MG PER TABLET    Take 1 tablet by mouth every 12 (twelve) hours.     Final impressions: 1. Animal bite   2. Open nondisplaced fracture of middle phalanx of right index finger, initial encounter   3. Laceration of lower leg, right, initial encounter   4. Laceration of index finger, initial encounter   5. Laceration of fifth  finger of right hand, initial encounter       Luiz Iron PA-C   This patient was discussed with Dr. Verne Carrow, PA-C 05/07/14 [phone]

## 2014-05-07 NOTE — ED Notes (Signed)
Ortho here to place splint to the right finger

## 2014-05-08 ENCOUNTER — Encounter (HOSPITAL_COMMUNITY): Payer: Self-pay | Admitting: *Deleted

## 2014-05-09 ENCOUNTER — Ambulatory Visit (HOSPITAL_COMMUNITY)
Admission: RE | Admit: 2014-05-09 | Discharge: 2014-05-09 | Disposition: A | Discharge status: Home / Self Care | Payer: 59 | Source: Ambulatory Visit | Attending: Orthopedic Surgery | Admitting: Orthopedic Surgery

## 2014-05-09 ENCOUNTER — Encounter (HOSPITAL_COMMUNITY): Payer: Self-pay | Admitting: Anesthesiology

## 2014-05-09 ENCOUNTER — Encounter (HOSPITAL_COMMUNITY): Payer: 59 | Admitting: Anesthesiology

## 2014-05-09 ENCOUNTER — Ambulatory Visit (HOSPITAL_COMMUNITY): Payer: 59 | Admitting: Anesthesiology

## 2014-05-09 ENCOUNTER — Encounter (HOSPITAL_COMMUNITY)
Admission: RE | Disposition: A | Discharge status: Home / Self Care | Payer: Self-pay | Source: Ambulatory Visit | Attending: Orthopedic Surgery

## 2014-05-09 DIAGNOSIS — S61209A Unspecified open wound of unspecified finger without damage to nail, initial encounter: Secondary | ICD-10-CM | POA: Insufficient documentation

## 2014-05-09 DIAGNOSIS — R7309 Other abnormal glucose: Secondary | ICD-10-CM | POA: Insufficient documentation

## 2014-05-09 DIAGNOSIS — IMO0002 Reserved for concepts with insufficient information to code with codable children: Secondary | ICD-10-CM | POA: Insufficient documentation

## 2014-05-09 DIAGNOSIS — W540XXA Bitten by dog, initial encounter: Secondary | ICD-10-CM | POA: Insufficient documentation

## 2014-05-09 HISTORY — DX: Failed or difficult intubation, initial encounter: T88.4XXA

## 2014-05-09 HISTORY — DX: Personal history of urinary calculi: Z87.442

## 2014-05-09 HISTORY — DX: Nausea with vomiting, unspecified: R11.2

## 2014-05-09 HISTORY — PX: INCISION AND DRAINAGE OF WOUND: SHX1803

## 2014-05-09 HISTORY — DX: Other specified postprocedural states: Z98.890

## 2014-05-09 LAB — BASIC METABOLIC PANEL
Anion gap: 16 — ABNORMAL HIGH (ref 5–15)
BUN: 9 mg/dL (ref 6–23)
CALCIUM: 9.2 mg/dL (ref 8.4–10.5)
CO2: 24 mEq/L (ref 19–32)
Chloride: 99 mEq/L (ref 96–112)
Creatinine, Ser: 0.42 mg/dL — ABNORMAL LOW (ref 0.50–1.10)
GFR calc Af Amer: >90 mL/min (ref >90)
GFR calc non Af Amer: >90 mL/min (ref >90)
GLUCOSE: 108 mg/dL — AB (ref 70–99)
Potassium: 5.2 mEq/L (ref 3.7–5.3)
SODIUM: 139 meq/L (ref 137–147)

## 2014-05-09 LAB — CBC
HCT: 37.4 % (ref 36.0–46.0)
Hemoglobin: 12.7 g/dL (ref 12.0–15.0)
MCH: 29 pg (ref 26.0–34.0)
MCHC: 34 g/dL (ref 30.0–36.0)
MCV: 85.4 fL (ref 78.0–100.0)
PLATELETS: 316 10*3/uL (ref 150–400)
RBC: 4.38 MIL/uL (ref 3.87–5.11)
RDW: 13.5 % (ref 11.5–15.5)
WBC: 8.5 10*3/uL (ref 4.0–10.5)

## 2014-05-09 LAB — GLUCOSE, CAPILLARY
Glucose-Capillary: 106 mg/dL — ABNORMAL HIGH (ref 70–99)
Glucose-Capillary: 167 mg/dL — ABNORMAL HIGH (ref 70–99)

## 2014-05-09 SURGERY — IRRIGATION AND DEBRIDEMENT WOUND
Anesthesia: General | Site: Finger | Laterality: Right

## 2014-05-09 MED ORDER — LACTATED RINGERS IV SOLN
INTRAVENOUS | Status: DC | PRN
  Administered 2014-05-09: 16:00:00 via INTRAVENOUS

## 2014-05-09 MED ORDER — SODIUM CHLORIDE 0.9 % IR SOLN
Status: DC | PRN
  Administered 2014-05-09: 1000 mL

## 2014-05-09 MED ORDER — PHENYLEPHRINE 40 MCG/ML (10ML) SYRINGE FOR IV PUSH (FOR BLOOD PRESSURE SUPPORT)
PREFILLED_SYRINGE | INTRAVENOUS | Status: AC
  Filled 2014-05-09: qty 20

## 2014-05-09 MED ORDER — MIDAZOLAM HCL 2 MG/2ML IJ SOLN
INTRAMUSCULAR | Status: AC
  Filled 2014-05-09: qty 2

## 2014-05-09 MED ORDER — LIDOCAINE HCL (CARDIAC) 20 MG/ML IV SOLN
INTRAVENOUS | Status: DC | PRN
  Administered 2014-05-09: 70 mg via INTRAVENOUS

## 2014-05-09 MED ORDER — GLYCOPYRROLATE 0.2 MG/ML IJ SOLN
INTRAMUSCULAR | Status: AC
  Filled 2014-05-09: qty 1

## 2014-05-09 MED ORDER — ONDANSETRON HCL 4 MG/2ML IJ SOLN
INTRAMUSCULAR | Status: DC | PRN
  Administered 2014-05-09: 4 mg via INTRAVENOUS

## 2014-05-09 MED ORDER — DOCUSATE SODIUM 100 MG PO CAPS: 100.0000 mg | ORAL_CAPSULE | Freq: Two times a day (BID) | ORAL | Status: DC

## 2014-05-09 MED ORDER — CHLORHEXIDINE GLUCONATE 4 % EX LIQD: 60.0000 mL | Freq: Once | CUTANEOUS | Status: DC

## 2014-05-09 MED ORDER — BUPIVACAINE HCL (PF) 0.25 % IJ SOLN
INTRAMUSCULAR | Status: AC
  Filled 2014-05-09: qty 30

## 2014-05-09 MED ORDER — SCOPOLAMINE 1 MG/3DAYS TD PT72
1.0000 | MEDICATED_PATCH | TRANSDERMAL | Status: DC
  Administered 2014-05-09: 1.5 mg via TRANSDERMAL

## 2014-05-09 MED ORDER — LACTATED RINGERS IV SOLN
INTRAVENOUS | Status: DC
  Administered 2014-05-09: 14:00:00 via INTRAVENOUS

## 2014-05-09 MED ORDER — CLINDAMYCIN PHOSPHATE 900 MG/50ML IV SOLN
900.0000 mg | INTRAVENOUS | Status: AC
  Administered 2014-05-09: 900 mg via INTRAVENOUS
  Filled 2014-05-09: qty 50

## 2014-05-09 MED ORDER — NEOSTIGMINE METHYLSULFATE 10 MG/10ML IV SOLN
INTRAVENOUS | Status: AC
  Filled 2014-05-09: qty 1

## 2014-05-09 MED ORDER — OXYCODONE HCL 5 MG PO TABS: 5.0000 mg | ORAL_TABLET | Freq: Once | ORAL | Status: DC | PRN

## 2014-05-09 MED ORDER — PROMETHAZINE HCL 25 MG/ML IJ SOLN
6.2500 mg | INTRAMUSCULAR | Status: DC | PRN
Start: 2014-05-09 — End: 2014-05-09

## 2014-05-09 MED ORDER — FENTANYL CITRATE 0.05 MG/ML IJ SOLN
INTRAMUSCULAR | Status: AC
  Filled 2014-05-09: qty 5

## 2014-05-09 MED ORDER — OXYCODONE HCL 5 MG/5ML PO SOLN: 5.0000 mg | Freq: Once | ORAL | Status: DC | PRN

## 2014-05-09 MED ORDER — DEXAMETHASONE SODIUM PHOSPHATE 4 MG/ML IJ SOLN
INTRAMUSCULAR | Status: DC | PRN
  Administered 2014-05-09: 8 mg via INTRAVENOUS

## 2014-05-09 MED ORDER — PROPOFOL 10 MG/ML IV BOLUS
INTRAVENOUS | Status: AC
  Filled 2014-05-09: qty 20

## 2014-05-09 MED ORDER — PROPOFOL 10 MG/ML IV BOLUS
INTRAVENOUS | Status: DC | PRN
  Administered 2014-05-09: 200 mg via INTRAVENOUS

## 2014-05-09 MED ORDER — MIDAZOLAM HCL 5 MG/5ML IJ SOLN
INTRAMUSCULAR | Status: DC | PRN
  Administered 2014-05-09: 2 mg via INTRAVENOUS

## 2014-05-09 MED ORDER — FENTANYL CITRATE 0.05 MG/ML IJ SOLN
INTRAMUSCULAR | Status: DC | PRN
  Administered 2014-05-09: 50 ug via INTRAVENOUS
  Administered 2014-05-09: 25 ug via INTRAVENOUS
  Administered 2014-05-09 (×3): 50 ug via INTRAVENOUS
  Administered 2014-05-09: 25 ug via INTRAVENOUS

## 2014-05-09 MED ORDER — HYDROCODONE-ACETAMINOPHEN 5-300 MG PO TABS: 1.0000 | ORAL_TABLET | Freq: Four times a day (QID) | ORAL | Status: DC | PRN

## 2014-05-09 MED ORDER — HYDROMORPHONE HCL PF 1 MG/ML IJ SOLN
0.2500 mg | INTRAMUSCULAR | Status: DC | PRN
Start: 2014-05-09 — End: 2014-05-09

## 2014-05-09 MED ORDER — BUPIVACAINE HCL (PF) 0.25 % IJ SOLN
INTRAMUSCULAR | Status: DC | PRN
  Administered 2014-05-09: 7 mL

## 2014-05-09 MED ORDER — SCOPOLAMINE 1 MG/3DAYS TD PT72
MEDICATED_PATCH | TRANSDERMAL | Status: AC
  Filled 2014-05-09: qty 1

## 2014-05-09 SURGICAL SUPPLY — 62 items
BANDAGE ELASTIC 3 VELCRO ST LF (GAUZE/BANDAGES/DRESSINGS) ×2 IMPLANT
BANDAGE ELASTIC 4 VELCRO ST LF (GAUZE/BANDAGES/DRESSINGS) ×2 IMPLANT
BANDAGE GAUZE ELAST BULKY 4 IN (GAUZE/BANDAGES/DRESSINGS) ×2 IMPLANT
BNDG CMPR 9X4 STRL LF SNTH (GAUZE/BANDAGES/DRESSINGS) ×1
BNDG CMPR MD 5X2 ELC HKLP STRL (GAUZE/BANDAGES/DRESSINGS) ×1
BNDG COHESIVE 1X5 TAN STRL LF (GAUZE/BANDAGES/DRESSINGS) ×1 IMPLANT
BNDG CONFORM 2 STRL LF (GAUZE/BANDAGES/DRESSINGS) ×1 IMPLANT
BNDG ELASTIC 2 VLCR STRL LF (GAUZE/BANDAGES/DRESSINGS) ×1 IMPLANT
BNDG ESMARK 4X9 LF (GAUZE/BANDAGES/DRESSINGS) ×2 IMPLANT
CORDS BIPOLAR (ELECTRODE) ×2 IMPLANT
COVER SURGICAL LIGHT HANDLE (MISCELLANEOUS) ×2 IMPLANT
CUFF TOURNIQUET SINGLE 18IN (TOURNIQUET CUFF) ×2 IMPLANT
CUFF TOURNIQUET SINGLE 24IN (TOURNIQUET CUFF) IMPLANT
DRAIN PENROSE 1/4X12 LTX STRL (WOUND CARE) IMPLANT
DRAPE OEC MINIVIEW 54X84 (DRAPES) ×1 IMPLANT
DRAPE SURG 17X23 STRL (DRAPES) ×2 IMPLANT
DRSG ADAPTIC 3X8 NADH LF (GAUZE/BANDAGES/DRESSINGS) ×2 IMPLANT
ELECT REM PT RETURN 9FT ADLT (ELECTROSURGICAL)
ELECTRODE REM PT RTRN 9FT ADLT (ELECTROSURGICAL) IMPLANT
GAUZE XEROFORM 1X8 LF (GAUZE/BANDAGES/DRESSINGS) ×2 IMPLANT
GAUZE XEROFORM 5X9 LF (GAUZE/BANDAGES/DRESSINGS) IMPLANT
GLOVE BIOGEL PI IND STRL 8.5 (GLOVE) ×1 IMPLANT
GLOVE BIOGEL PI INDICATOR 8.5 (GLOVE) ×1
GLOVE SURG ORTHO 8.0 STRL STRW (GLOVE) ×2 IMPLANT
GOWN STRL REUS W/ TWL LRG LVL3 (GOWN DISPOSABLE) ×3 IMPLANT
GOWN STRL REUS W/ TWL XL LVL3 (GOWN DISPOSABLE) ×1 IMPLANT
GOWN STRL REUS W/TWL LRG LVL3 (GOWN DISPOSABLE) ×6
GOWN STRL REUS W/TWL XL LVL3 (GOWN DISPOSABLE) ×2
HANDPIECE INTERPULSE COAX TIP (DISPOSABLE)
K-WIRE ×1 IMPLANT
K-WIRE DBL TROCAR .045X4 ×2 IMPLANT
KIT BASIN OR (CUSTOM PROCEDURE TRAY) ×2 IMPLANT
KIT ROOM TURNOVER OR (KITS) ×2 IMPLANT
KWIRE DBL TROCAR .045X4 IMPLANT
MANIFOLD NEPTUNE II (INSTRUMENTS) ×2 IMPLANT
NDL HYPO 25GX1X1/2 BEV (NEEDLE) IMPLANT
NEEDLE HYPO 25GX1X1/2 BEV (NEEDLE) IMPLANT
NS IRRIG 1000ML POUR BTL (IV SOLUTION) ×2 IMPLANT
PACK ORTHO EXTREMITY (CUSTOM PROCEDURE TRAY) ×2 IMPLANT
PAD ARMBOARD 7.5X6 YLW CONV (MISCELLANEOUS) ×4 IMPLANT
PAD CAST 4YDX4 CTTN HI CHSV (CAST SUPPLIES) ×1 IMPLANT
PADDING CAST COTTON 4X4 STRL (CAST SUPPLIES) ×2
SET HNDPC FAN SPRY TIP SCT (DISPOSABLE) IMPLANT
SOAP 2 % CHG 4 OZ (WOUND CARE) ×2 IMPLANT
SPLINT FINGER (SOFTGOODS) ×1 IMPLANT
SPONGE GAUZE 4X4 12PLY (GAUZE/BANDAGES/DRESSINGS) ×2 IMPLANT
SPONGE LAP 18X18 X RAY DECT (DISPOSABLE) ×2 IMPLANT
SPONGE LAP 4X18 X RAY DECT (DISPOSABLE) ×2 IMPLANT
SUCTION FRAZIER TIP 10 FR DISP (SUCTIONS) ×2 IMPLANT
SUT ETHILON 4 0 PS 2 18 (SUTURE) IMPLANT
SUT ETHILON 5 0 P 3 18 (SUTURE) ×1
SUT FIBER WIRE 4.0 (SUTURE) ×3 IMPLANT
SUT MERSILENE 4 0 P 3 (SUTURE) ×1 IMPLANT
SUT NYLON ETHILON 5-0 P-3 1X18 (SUTURE) ×1 IMPLANT
SYR CONTROL 10ML LL (SYRINGE) IMPLANT
TOWEL OR 17X24 6PK STRL BLUE (TOWEL DISPOSABLE) ×2 IMPLANT
TOWEL OR 17X26 10 PK STRL BLUE (TOWEL DISPOSABLE) ×2 IMPLANT
TUBE ANAEROBIC SPECIMEN COL (MISCELLANEOUS) IMPLANT
TUBE CONNECTING 12X1/4 (SUCTIONS) ×2 IMPLANT
UNDERPAD 30X30 INCONTINENT (UNDERPADS AND DIAPERS) ×2 IMPLANT
WATER STERILE IRR 1000ML POUR (IV SOLUTION) ×2 IMPLANT
YANKAUER SUCT BULB TIP NO VENT (SUCTIONS) ×2 IMPLANT

## 2014-05-09 NOTE — Anesthesia Preprocedure Evaluation (Signed)
Anesthesia Evaluation  Patient identified by MRN, date of birth, ID band Patient awake    History of Anesthesia Complications (+) PONV, DIFFICULT AIRWAY and history of anesthetic complications  Airway Mallampati: II TM Distance: >3 FB Neck ROM: Full    Dental  (+) Teeth Intact, Dental Advisory Given   Pulmonary neg pulmonary ROS,    Pulmonary exam normal       Cardiovascular     Neuro/Psych    GI/Hepatic   Endo/Other  diabetes  Renal/GU      Musculoskeletal   Abdominal   Peds  Hematology   Anesthesia Other Findings   Reproductive/Obstetrics                           Anesthesia Physical Anesthesia Plan  ASA: I  Anesthesia Plan: General   Post-op Pain Management:    Induction: Intravenous  Airway Management Planned: LMA  Additional Equipment:   Intra-op Plan:   Post-operative Plan: Extubation in OR  Informed Consent: I have reviewed the patients History and Physical, chart, labs and discussed the procedure including the risks, benefits and alternatives for the proposed anesthesia with the patient or authorized representative who has indicated his/her understanding and acceptance.   Dental advisory given  Plan Discussed with: CRNA, Anesthesiologist and Surgeon  Anesthesia Plan Comments:         Anesthesia Quick Evaluation

## 2014-05-09 NOTE — Anesthesia Postprocedure Evaluation (Signed)
Anesthesia Post Note  Patient: Nicole HutchinsonBetsy B Sandstrom  Procedure(s) Performed: Procedure(s) (LRB): RIGHT INDEX FINGER OPEN DEBRIDEMENT AND PINNING with flexor and extensor tendon repair (Right)  Anesthesia type: general  Patient location: PACU  Post pain: Pain level controlled  Post assessment: Patient's Cardiovascular Status Stable  Last Vitals:  Filed Vitals:   05/09/14 1925  BP: 119/61  Pulse: 64  Temp:   Resp: 14    Post vital signs: Reviewed and stable  Level of consciousness: sedated  Complications: No apparent anesthesia complications

## 2014-05-09 NOTE — Discharge Instructions (Signed)
KEEP BANDAGE CLEAN AND DRY °CALL OFFICE FOR F/U APPT [phone] °DR Natane Heward CELL [phone] °KEEP HAND ELEVATED ABOVE HEART °OK TO APPLY ICE TO OPERATIVE AREA °CONTACT OFFICE IF ANY WORSENING PAIN OR CONCERNS. °

## 2014-05-09 NOTE — ED Provider Notes (Signed)
Medical screening examination/treatment/procedure(s) were conducted as a shared visit with non-physician practitioner(s) and myself.  I personally evaluated the patient during the encounter.   EKG Interpretation None      Pt with puncture wound to the hand from dog bite and bite to the leg. Xrays confirm fracture. IVAB advised along with tetanus update if needed. PA-C to call ortho to see if patient needs OR washout vs. Close f/u.  Derwood KaplanAnkit Rivka Baune, MD 05/09/14 [phone]

## 2014-05-09 NOTE — Brief Op Note (Signed)
05/09/2014  2:28 PM  PATIENT:  Leanne B Dubach  53 y.o. female  PRE-OPERATIVE DIAGNOSIS:  right index finger dog bite  POST-OPERATIVE DIAGNOSIS:  * No post-op diagnosis entered *  PROCEDURE:  Procedure(s): RIGHT INDEX FINGER OPEN DEBRIDEMENT AND REPAIR AS INDICATED (Right)  SURGEON:  Surgeon(s) and Role:    * Sharma CovertFred W Kylor Valverde, MD - Primary  PHYSICIAN ASSISTANT:   ASSISTANTS: none   ANESTHESIA:   general  EBL:     BLOOD ADMINISTERED:none  DRAINS: none   LOCAL MEDICATIONS USED:  MARCAINE     SPECIMEN:  No Specimen  DISPOSITION OF SPECIMEN:  N/A  COUNTS:  YES  TOURNIQUET:    DICTATION: .[id number]  PLAN OF CARE: Discharge to home after PACU  PATIENT DISPOSITION:  PACU - hemodynamically stable.   Delay start of Pharmacological VTE agent (>24hrs) due to surgical blood loss or risk of bleeding: not applicable

## 2014-05-09 NOTE — Transfer of Care (Signed)
Immediate Anesthesia Transfer of Care Note  Patient: Ileene HutchinsonBetsy B Bacote  Procedure(s) Performed: Procedure(s): RIGHT INDEX FINGER OPEN DEBRIDEMENT AND PINNING with flexor and extensor tendon repair (Right)  Patient Location: PACU  Anesthesia Type:General  Level of Consciousness: awake, alert , oriented and patient cooperative  Airway & Oxygen Therapy: Patient Spontanous Breathing  Post-op Assessment: Report given to PACU RN and Post -op Vital signs reviewed and stable  Post vital signs: Reviewed and stable  Complications: No apparent anesthesia complications

## 2014-05-09 NOTE — H&P (Signed)
Nicole GarterBetsy B Powers is an 53 y.o. female.   Chief Complaint: RIGHT INDEX FINGER DOG BITE HPI: RIGHT HAND INJURY PT WAS INITIALLY TREATED IN ED SENT TO MY OFFICE  AFTER BEING EVALUATED IN OFFICE PT HERE FOR SURGERY ON RIGHT INDEX FINGER AFTER BITE FROM DOG AND FRACTURE NO PRIOR SURGERY TO RIGHT HAND  Past Medical History  Diagnosis Date  . Difficult intubation 2010  . PONV (postoperative nausea and vomiting)     not with 2010 surgery  . Diabetes mellitus without complication     pre diabetic- AiC 6.5  . History of kidney stones     Past Surgical History  Procedure Laterality Date  . Abdominal hysterectomy  2010    complete  . Diagnostic laparoscopy      "several" for endometetosis"  . Nasal septum surgery  1990    History reviewed. No pertinent family history. Social History:  reports that she has never smoked. She does not have any smokeless tobacco history on file. She reports that she drinks alcohol. She reports that she does not use illicit drugs.  Allergies: No Known Allergies  Medications Prior to Admission  Medication Sig Dispense Refill  . amoxicillin-clavulanate (AUGMENTIN) 875-125 MG per tablet Take 1 tablet by mouth every 12 (twelve) hours.  14 tablet  0  . MAGNESIUM PO Take 1 tablet by mouth daily.      . Omega-3 Fatty Acids (FISH OIL) 500 MG CAPS Take 1,500 mg by mouth daily.      . Probiotic Product (PROBIOTIC PO) Take 1 tablet by mouth daily.      Marland Kitchen. selenium 50 MCG TABS tablet Take 50 mcg by mouth daily.      . TURMERIC PO Take 1 tablet by mouth daily.        Results for orders placed during the hospital encounter of 05/09/14 (from the past 48 hour(s))  GLUCOSE, CAPILLARY     Status: Abnormal   Collection Time    05/09/14  1:43 PM      Result Value Ref Range   Glucose-Capillary 106 (*) 70 - 99 mg/dL  CBC     Status: None   Collection Time    05/09/14  1:47 PM      Result Value Ref Range   WBC 8.5  4.0 - 10.5 K/uL   RBC 4.38  3.87 - 5.11 MIL/uL   Hemoglobin 12.7  12.0 - 15.0 g/dL   HCT 40.937.4  81.136.0 - 91.446.0 %   MCV 85.4  78.0 - 100.0 fL   MCH 29.0  26.0 - 34.0 pg   MCHC 34.0  30.0 - 36.0 g/dL   RDW 78.213.5  95.611.5 - 21.315.5 %   Platelets 316  150 - 400 K/uL   No results found.  ROS NO RECENT ILLNESSES OR HOSPITALIZATIONS  Blood pressure 130/62, pulse 67, temperature 98.8 F (37.1 C), temperature source Oral, resp. rate 20, SpO2 100.00%. Physical Exam  General Appearance:  Alert, cooperative, no distress, appears stated age  Head:  Normocephalic, without obvious abnormality, atraumatic  Eyes:  Pupils equal, conjunctiva/corneas clear,         Throat: Lips, mucosa, and tongue normal; teeth and gums normal  Neck: No visible masses     Lungs:   respirations unlabored  Chest Wall:  No tenderness or deformity  Heart:  Regular rate and rhythm,  Abdomen:   Soft, non-tender,         Extremities: RIGHT HAND: OPEN WOUND OVER DORSUM OF RIGHT INDEX  FINGER WITH MILD DRAINAGE SEROUS ABLE TO WIGGLE INDEX FINGER LIMITED DIGITAL MOTION FINGER WARM WELL PERFUSED GOOD MOBILITY OF LONG/RING/SMALL   Pulses: 2+ and symmetric  Skin: Skin color, texture, turgor normal, no rashes or lesions     Neurologic: Normal   Assessment/Plan RIGHT INDEX FINGER OPEN WOUND WITH OPEN MIDDLE PHALANX FRACTURE  RIGHT INDEX FINGER OPEN WOUND DEBRIDEMENT AND OPEN REPAIR AS INDICATED  R/B/A DISCUSSED WITH PT IN OFFICE.  PT VOICED UNDERSTANDING OF PLAN CONSENT SIGNED DAY OF SURGERY PT SEEN AND EXAMINED PRIOR TO OPERATIVE PROCEDURE/DAY OF SURGERY SITE MARKED. QUESTIONS ANSWERED WILL GO HOME FOLLOWING SURGERY  WE ARE PLANNING SURGERY FOR YOUR UPPER EXTREMITY. THE RISKS AND BENEFITS OF SURGERY INCLUDE BUT NOT LIMITED TO BLEEDING INFECTION, DAMAGE TO NEARBY NERVES ARTERIES TENDONS, FAILURE OF SURGERY TO ACCOMPLISH ITS INTENDED GOALS, PERSISTENT SYMPTOMS AND NEED FOR FURTHER SURGICAL INTERVENTION. WITH THIS IN MIND WE WILL PROCEED. I HAVE DISCUSSED WITH THE PATIENT THE  PRE AND POSTOPERATIVE REGIMEN AND THE DOS AND DON'TS. PT VOICED UNDERSTANDING AND INFORMED CONSENT SIGNED.  Sharma CovertTMANN,Robyn Nohr W 05/09/2014, 1637 PM

## 2014-05-10 NOTE — Op Note (Signed)
[name], [name] NO.:  [id number]  MEDICAL RECORD NO.:  [id number]  LOCATION:  [facility]                         FACILITY:  [facility]  PHYSICIAN:  Nicole Done, MD  DATE OF BIRTH:  1961/05/24  DATE OF PROCEDURE:  05/09/2014 DATE OF DISCHARGE:  05/09/2014                              OPERATIVE REPORT   PREOPERATIVE DIAGNOSES: 1. Right index finger open middle phalanx fracture. 2. Right index finger dog bite.  POSTOPERATIVE DIAGNOSES: 1. Right index finger open middle phalanx fracture. 2. Right index finger dog bite.  ATTENDING PHYSICIAN:  Nicole Done, MD, who scrubbed and present for the entire procedure.  ASSISTANT SURGEON:  None.  ANESTHESIA:  General via LMA.  TOURNIQUET TIME:  1 hour at 250 mmHg.  SURGICAL PROCEDURE: 1. Right index finger excisional debridement of skin, subcutaneous     tissue, and bone associated with open fracture of right middle     phalanx. 2. Right index finger flexor sheath incision and drainage with wound     cultures. 3. Right index finger flexor digitorum profundus tendon repair zone 1     flexor tendon repair. 4. Right index finger zone 2 extensor tendon repair, primary repair. 5. Right index finger open treatment of middle phalanx fracture     involving the middle phalangeal shaft with internal fixation. 6. Radiographs 2 views right small finger.  SURGICAL IMPLANTS:  0.045 K-wire.  Radiographic interpretation AP and lateral views of the finger do show the K-wire fixation in place.  There is some very mild extension at the fracture site with good positioning of the K-wire on the PA.  SURGICAL INDICATIONS:  [name] is a 53 year old gentleman who had a near amputation of the index finger from a dog bite.  The patient presented to the the office with the open wound, it was recommended she undergo the above procedure.  Risks, benefits, and alternatives were discussed in detail with the patient and signed  informed consent was obtained.  Risks include, but not limited to bleeding, infection, damage to nearby nerves, arteries, or tendons, loss of motion of wrist and digits, incomplete relief of symptoms, nonunion, malunion, hardware failure, and need for further surgical intervention.  DESCRIPTION OF PROCEDURE:  The patient was properly identified in the preoperative holding area and a mark with a permanent marker made on the right index finger to indicate the correct operative site.  The patient was then brought back to the operating room, placed supine on the anesthesia table, general anesthesia was administered.  The patient tolerated this well.  A well-padded tourniquet was then placed on the right brachium and sealed with 1000 drape.  The right upper extremity was then prepped and draped in normal sterile fashion.  Time-out was called.  Correct site was identified, and procedure then begun. Attention then turned to the right index finger where the volar wound was then opened up and extended both proximally.  This exposed the complete laceration of the FDP.  The FDP was retracted all the way in the palm.  Mid axial incision was then carried down and extended proximally to expose the flexor sheath.  Neurovascular bundles were in  continuity and protected throughout.  The FDP was then allowed to be retrieved proximally and then fed through the A4 pulley distally.  Prior to repair of the flexor tendon, excisional debridement of skin, subcutaneous tissue, and bone was then carried out of the open fracture site of the middle phalanx to the volar wound.  Flexor tendon sheath drainage was also carried out.  The patient did have a mild amount of purulence coming from the wound and wound cultures were taken.  Thorough irrigation and debridement was then carried out.  Following this, a 0.045 K-wire was then placed through the distal phalanx all the way across the fracture site into the middle  phalanx at the subchondral bone of the proximal phalanx with good purchase.  This longitudinal K-wire was felt to be sufficient given this flexor and extensor tendon injuries.  The wound was then thoroughly irrigated.  The flexor tendon was then repaired after stabilization of the bone.  This was then Powers with 6-core strand modified Kessler and horizontal mattress sutures supplemented by 2 more FiberWire sutures along the margins for a total of 8 strands.  The wound was then irrigated.  Following this, the volar wound was then loosely reapproximated with 4-0 Prolene suture. Attention was then turned dorsally where the dorsal transverse incision over the level of the middle phalanx was then extended proximally.  The wound was then irrigated.  Excisional debridement was then carried out and the extensor tendon zone 2 was then repaired with several figure-of- eight 4-0 Mersilene suture.  Wound was irrigated, skin was loosely reapproximated and closed with Prolene suture.  10 mL of 0.25% Marcaine infiltrated for metacarpal block.  Adaptic dressing, sterile compressive bandage then applied.  The patient tolerated the procedure well, was placed in a finger splint, extubated, and taken to the recovery room in good condition.  POSTPROCEDURAL PLAN:  The patient discharged to home, seen back in the office in approximately 6 days for wound check, x-rays, and then finger splint, pin in for a total of 4 weeks, and then begin an outpatient therapy regimen for zone 1 flexor tendon repair protocol.  PROGNOSIS:  Very guarded prognosis with the finger in terms of getting back mobility at the DIP joint with the open fracture, injury to the and extensor and flexor surfaces with the open fracture, it is going to be very tough to get back the DIP movement, and we will try to preserve as much PIP movement as possible.     Nicole DoneFred W Kenasia Scheller IV, MD     FWO/MEDQ  D:  05/09/2014  T:  05/10/2014  Job:   [id number]

## 2014-05-11 ENCOUNTER — Encounter (HOSPITAL_COMMUNITY): Payer: Self-pay | Admitting: Orthopedic Surgery

## 2014-05-13 LAB — TISSUE CULTURE: CULTURE: NO GROWTH

## 2014-05-14 LAB — ANAEROBIC CULTURE

## 2014-05-22 ENCOUNTER — Encounter (HOSPITAL_COMMUNITY): Payer: Self-pay | Admitting: Orthopedic Surgery

## 2014-05-22 NOTE — Addendum Note (Signed)
Addendum created 05/22/14 1409 by Arta BruceKevin Collin Rengel, MD   Modules edited: Anesthesia Events

## 2014-10-17 ENCOUNTER — Other Ambulatory Visit: Payer: Self-pay | Admitting: Obstetrics and Gynecology

## 2014-10-18 LAB — CYTOLOGY - PAP

## 2014-10-22 ENCOUNTER — Other Ambulatory Visit: Payer: Self-pay | Admitting: Obstetrics and Gynecology

## 2014-10-22 DIAGNOSIS — Z803 Family history of malignant neoplasm of breast: Secondary | ICD-10-CM

## 2014-10-22 DIAGNOSIS — R922 Inconclusive mammogram: Secondary | ICD-10-CM

## 2014-10-30 ENCOUNTER — Other Ambulatory Visit: Payer: 59

## 2015-08-06 ENCOUNTER — Telehealth: Payer: Self-pay | Admitting: *Deleted

## 2015-08-09 NOTE — Telephone Encounter (Signed)
Entered in error

## 2016-11-09 DIAGNOSIS — L819 Disorder of pigmentation, unspecified: Secondary | ICD-10-CM | POA: Diagnosis not present

## 2017-03-30 DIAGNOSIS — H47321 Drusen of optic disc, right eye: Secondary | ICD-10-CM | POA: Diagnosis not present

## 2017-04-05 DIAGNOSIS — Z01419 Encounter for gynecological examination (general) (routine) without abnormal findings: Secondary | ICD-10-CM | POA: Diagnosis not present

## 2017-04-05 DIAGNOSIS — Z1231 Encounter for screening mammogram for malignant neoplasm of breast: Secondary | ICD-10-CM | POA: Diagnosis not present

## 2017-04-28 ENCOUNTER — Other Ambulatory Visit: Payer: Self-pay | Admitting: Obstetrics and Gynecology

## 2017-04-28 DIAGNOSIS — Z803 Family history of malignant neoplasm of breast: Secondary | ICD-10-CM

## 2017-05-15 ENCOUNTER — Other Ambulatory Visit: Payer: Self-pay

## 2017-05-18 ENCOUNTER — Ambulatory Visit
Admission: RE | Admit: 2017-05-18 | Discharge: 2017-05-18 | Disposition: A | Discharge status: Home / Self Care | Payer: 59 | Source: Ambulatory Visit | Attending: Obstetrics and Gynecology | Admitting: Obstetrics and Gynecology

## 2017-05-18 ENCOUNTER — Other Ambulatory Visit: Payer: Self-pay | Admitting: Obstetrics and Gynecology

## 2017-05-18 DIAGNOSIS — Z803 Family history of malignant neoplasm of breast: Secondary | ICD-10-CM | POA: Diagnosis not present

## 2017-05-18 DIAGNOSIS — R928 Other abnormal and inconclusive findings on diagnostic imaging of breast: Secondary | ICD-10-CM

## 2017-05-18 MED ORDER — GADOBENATE DIMEGLUMINE 529 MG/ML IV SOLN
15.0000 mL | Freq: Once | INTRAVENOUS | Status: AC | PRN
  Administered 2017-05-18: 15 mL via INTRAVENOUS

## 2017-05-21 ENCOUNTER — Ambulatory Visit
Admission: RE | Admit: 2017-05-21 | Discharge: 2017-05-21 | Disposition: A | Discharge status: Home / Self Care | Payer: 59 | Source: Ambulatory Visit | Attending: Obstetrics and Gynecology | Admitting: Obstetrics and Gynecology

## 2017-05-21 ENCOUNTER — Other Ambulatory Visit: Payer: Self-pay | Admitting: Obstetrics and Gynecology

## 2017-05-21 DIAGNOSIS — N62 Hypertrophy of breast: Secondary | ICD-10-CM | POA: Diagnosis not present

## 2017-05-21 DIAGNOSIS — R928 Other abnormal and inconclusive findings on diagnostic imaging of breast: Secondary | ICD-10-CM

## 2017-05-21 DIAGNOSIS — N6012 Diffuse cystic mastopathy of left breast: Secondary | ICD-10-CM | POA: Diagnosis not present

## 2017-05-21 DIAGNOSIS — N6011 Diffuse cystic mastopathy of right breast: Secondary | ICD-10-CM | POA: Diagnosis not present

## 2017-05-21 DIAGNOSIS — N6489 Other specified disorders of breast: Secondary | ICD-10-CM | POA: Diagnosis not present

## 2017-05-21 MED ORDER — GADOBENATE DIMEGLUMINE 529 MG/ML IV SOLN
15.0000 mL | Freq: Once | INTRAVENOUS | Status: AC | PRN
  Administered 2017-05-21: 15 mL via INTRAVENOUS

## 2017-06-10 ENCOUNTER — Other Ambulatory Visit: Payer: Self-pay | Admitting: Surgery

## 2017-06-10 DIAGNOSIS — R928 Other abnormal and inconclusive findings on diagnostic imaging of breast: Secondary | ICD-10-CM | POA: Diagnosis not present

## 2017-06-11 ENCOUNTER — Other Ambulatory Visit: Payer: Self-pay | Admitting: Surgery

## 2017-06-11 DIAGNOSIS — R928 Other abnormal and inconclusive findings on diagnostic imaging of breast: Secondary | ICD-10-CM

## 2017-07-08 ENCOUNTER — Encounter (HOSPITAL_COMMUNITY): Payer: Self-pay | Admitting: *Deleted

## 2017-07-14 ENCOUNTER — Ambulatory Visit
Admission: RE | Admit: 2017-07-14 | Discharge: 2017-07-14 | Disposition: A | Discharge status: Home / Self Care | Payer: 59 | Source: Ambulatory Visit | Attending: Surgery | Admitting: Surgery

## 2017-07-14 DIAGNOSIS — D0502 Lobular carcinoma in situ of left breast: Secondary | ICD-10-CM | POA: Diagnosis not present

## 2017-07-14 DIAGNOSIS — R928 Other abnormal and inconclusive findings on diagnostic imaging of breast: Secondary | ICD-10-CM

## 2017-07-14 NOTE — Progress Notes (Signed)
Ensure pre surgery drink given with instructions to complete by 0800 dos, pt verbalized understanding. 

## 2017-07-15 NOTE — H&P (Signed)
Nicole AmatoBetsy Powers  Location: [facility] Patient #: [id number] DOB: Mar 25, 1961 Married / Language: English / Race: White Female  History of Present Illness   The patient is a 56 year old female who presents with a complaint of breast lesion.  The PCP is Dr. Roseanne RenoJ. Griffin  The patient was referred by Dr. Norvel RichardsS. Reid  She is accompanied by her husband.  She has had annual mammograms. I think because of her family history and the density of her breast she was suggested to get MRI. She had an MRI - 05/18/2017 - IMPRESSION: 1. Linear clumped non mass enhancement within the lower outer quadrant of the LEFT breast, measuring 5.7 cm extent, extending from anterior to posterior depth. This is a suspicious finding for which MRI guided biopsy is recommended. Recommend biopsy sampling at both the anterior and posterior aspects of this non mass enhancement. 2. Focal clumped non mass enhancement within the upper inner quadrant of the RIGHT breast, at anterior depth, measuring 1.7 cm extent. This is also a suspicious finding for which MRI guided biopsy is recommended. The patient had a right and left breast biopsy on 21 May 2017. The biopsy ([id number]) showed 1) RIGHT - fibrocystic changes, 2) LEFT LOQ - CSL, 3) LEFT LOQ - fibrocystic changes.  She's had no prior breast biopsy. Her sister developed breast cancer at age 56 and died at about 6218 months. She had a aunt who has breast cancer and is living. She is noticed no breast changes or masses herself. She is not on hormonal therapy.  Past Medical History: 1) Right index finger dog bite Melvyn Novas- Ortmann - 05/2014 2) Hysterectomy - 2010 For endometriosis 3) History of appendectomy 4. Chipped a tooth once while intubated - hard to intubate ??? 5. Questionably pre diabetic - but she is not on meds.  Social History:  Married, Molly MaduroRobert with her. No children  She works as Building services engineerCFO of [employer]  Past Surgical History  ([place]; 06/10/2017 1:40 PM) Appendectomy  Breast Biopsy  Bilateral. Hysterectomy (due to cancer) - Complete  Hysterectomy (not due to cancer) - Complete  Oral Surgery   Diagnostic Studies History (April Staton, CMA; 06/10/2017 1:40 PM) Colonoscopy  1-5 years ago Mammogram  within last year Pap Smear  1-5 years ago  Allergies (April Staton, CMA; 06/10/2017 1:41 PM) No Known Drug Allergies 06/10/2017  Medication History (April Staton, CMA; 06/10/2017 1:43 PM) Fish Oil (1000MG  Capsule, Oral) Active. Magnesium (500MG  Tablet, Oral) Active. Probiotic (Oral) Active. Selenium (50MCG Tablet, Oral) Active. Turmeric Curcumin (Oral) Active. Vitamin B12 (Oral) Specific strength unknown - Active. Medications Reconciled  Social History ([place]; 06/10/2017 1:40 PM) Alcohol use  Occasional alcohol use. Caffeine use  Coffee. No drug use  Tobacco use  Never smoker.  Family History ([place]; 06/10/2017 1:40 PM) Breast Cancer  Sister. Cancer  Mother. Cerebrovascular Accident  Father. Diabetes Mellitus  Sister. Heart disease in female family member before age 56  Thyroid problems  Sister.  Pregnancy / Birth History (April Joana ReamerStaton, CMA; 06/10/2017 1:40 PM) Age at menarche  11 years. Age of menopause  896-50 Contraceptive History  Depo-provera, Oral contraceptives. Gravida  1 Irregular periods  Maternal age  56-40 Para  0  Other Problems (April Staton, CMA; 06/10/2017 1:40 PM) Kidney Stone  Lump In Breast  Oophorectomy     Review of Systems (April Staton CMA; 06/10/2017 1:40 PM) General Not Present- Appetite Loss, Chills, Fatigue, Fever, Night Sweats, Weight Gain and  Weight Loss. Skin Not Present- Change in Wart/Mole, Dryness, Hives, Jaundice, New Lesions, Non-Healing Wounds, Rash and Ulcer. HEENT Not Present- Earache, Hearing Loss, Hoarseness, Nose Bleed, Oral Ulcers, Ringing in the Ears, Seasonal Allergies, Sinus Pain, Sore Throat,  Visual Disturbances, Wears glasses/contact lenses and Yellow Eyes. Respiratory Not Present- Bloody sputum, Chronic Cough, Difficulty Breathing, Snoring and Wheezing. Breast Present- Breast Mass. Not Present- Breast Pain, Nipple Discharge and Skin Changes. Cardiovascular Not Present- Chest Pain, Difficulty Breathing Lying Down, Leg Cramps, Palpitations, Rapid Heart Rate, Shortness of Breath and Swelling of Extremities. Gastrointestinal Not Present- Abdominal Pain, Bloating, Bloody Stool, Change in Bowel Habits, Chronic diarrhea, Constipation, Difficulty Swallowing, Excessive gas, Gets full quickly at meals, Hemorrhoids, Indigestion, Nausea, Rectal Pain and Vomiting. Female Genitourinary Not Present- Frequency, Nocturia, Painful Urination, Pelvic Pain and Urgency. Musculoskeletal Not Present- Back Pain, Joint Pain, Joint Stiffness, Muscle Pain, Muscle Weakness and Swelling of Extremities. Neurological Not Present- Decreased Memory, Fainting, Headaches, Numbness, Seizures, Tingling, Tremor, Trouble walking and Weakness. Psychiatric Not Present- Anxiety, Bipolar, Change in Sleep Pattern, Depression, Fearful and Frequent crying. Endocrine Not Present- Cold Intolerance, Excessive Hunger, Hair Changes, Heat Intolerance, Hot flashes and New Diabetes. Hematology Not Present- Blood Thinners, Easy Bruising, Excessive bleeding, Gland problems, HIV and Persistent Infections.  Vitals (April Staton CMA; 06/10/2017 1:43 PM) 06/10/2017 1:43 PM Weight: 167.25 lb Height: 62in Body Surface Area: 1.77 m Body Mass Index: 30.59 kg/m  Temp.: 97.109F(Oral)  Pulse: 62 (Regular)  BP: 118/74 (Sitting, Left Arm, Standard)   Physical Exam  General: alert and generally healthy appearing. HEENT: Normal. Pupils equal.  Neck: Supple. No mass. No thyroid mass.  Lymph Nodes: No supraclavicular or cervical or axillary nodes.  Lungs: Clear to auscultation and symmetric breath sounds. Heart: RRR. No murmur or  rub.  Breasts: Right - Bruise in UIQ. I do not feel a mass.  Left - 2 puncture site wounds, bruise in LOQ of breast. I do not feel a mass.  Abdomen: Soft. No mass. No tenderness. No hernia. Normal bowel sounds.   Extremities: Good strength and ROM in upper and lower extremities.  Neurologic: Grossly intact to motor and sensory function. Psychiatric: Has normal mood and affect. Behavior is normal.   Assessment & Plan  1.  ABNORMALITY OF LEFT BREAST ON SCREENING MAMMOGRAM (R92.8)  Story: Left breast biopsy on 21 May 2017. The biopsy ([id number]) showed LEFT LOQ - CSL  Plan:  1) Left breast (seed localized) biopsy  2. Chipped a tooth once while intubated - hard to intubate ??? 3. Questionably pre diabetic - but she is not on meds.   Ovidio Kin, MD, [facility] Pager: [phone] Office phone:  [phone]

## 2017-07-16 ENCOUNTER — Encounter (HOSPITAL_BASED_OUTPATIENT_CLINIC_OR_DEPARTMENT_OTHER)
Admission: RE | Disposition: A | Discharge status: Home / Self Care | Payer: Self-pay | Source: Ambulatory Visit | Attending: Surgery

## 2017-07-16 ENCOUNTER — Encounter (HOSPITAL_BASED_OUTPATIENT_CLINIC_OR_DEPARTMENT_OTHER): Payer: Self-pay | Admitting: *Deleted

## 2017-07-16 ENCOUNTER — Ambulatory Visit (HOSPITAL_BASED_OUTPATIENT_CLINIC_OR_DEPARTMENT_OTHER)
Admission: RE | Admit: 2017-07-16 | Discharge: 2017-07-16 | Disposition: A | Discharge status: Home / Self Care | Payer: 59 | Source: Ambulatory Visit | Attending: Surgery | Admitting: Surgery

## 2017-07-16 ENCOUNTER — Ambulatory Visit (HOSPITAL_BASED_OUTPATIENT_CLINIC_OR_DEPARTMENT_OTHER): Payer: 59 | Admitting: Anesthesiology

## 2017-07-16 ENCOUNTER — Ambulatory Visit
Admission: RE | Admit: 2017-07-16 | Discharge: 2017-07-16 | Disposition: A | Discharge status: Home / Self Care | Payer: 59 | Source: Ambulatory Visit | Attending: Surgery | Admitting: Surgery

## 2017-07-16 DIAGNOSIS — Z8249 Family history of ischemic heart disease and other diseases of the circulatory system: Secondary | ICD-10-CM | POA: Insufficient documentation

## 2017-07-16 DIAGNOSIS — N6489 Other specified disorders of breast: Secondary | ICD-10-CM | POA: Diagnosis not present

## 2017-07-16 DIAGNOSIS — Z803 Family history of malignant neoplasm of breast: Secondary | ICD-10-CM | POA: Diagnosis not present

## 2017-07-16 DIAGNOSIS — R928 Other abnormal and inconclusive findings on diagnostic imaging of breast: Secondary | ICD-10-CM

## 2017-07-16 DIAGNOSIS — Z833 Family history of diabetes mellitus: Secondary | ICD-10-CM | POA: Diagnosis not present

## 2017-07-16 DIAGNOSIS — N63 Unspecified lump in unspecified breast: Secondary | ICD-10-CM | POA: Insufficient documentation

## 2017-07-16 DIAGNOSIS — Z808 Family history of malignant neoplasm of other organs or systems: Secondary | ICD-10-CM | POA: Diagnosis not present

## 2017-07-16 HISTORY — DX: Prediabetes: R73.03

## 2017-07-16 HISTORY — PX: RADIOACTIVE SEED GUIDED EXCISIONAL BREAST BIOPSY: SHX6490

## 2017-07-16 SURGERY — RADIOACTIVE SEED GUIDED BREAST BIOPSY
Anesthesia: General | Site: Breast | Laterality: Left

## 2017-07-16 MED ORDER — ACETAMINOPHEN 500 MG PO TABS
1000.0000 mg | ORAL_TABLET | ORAL | Status: AC
  Administered 2017-07-16: 1000 mg via ORAL

## 2017-07-16 MED ORDER — PROPOFOL 10 MG/ML IV BOLUS
INTRAVENOUS | Status: DC | PRN
  Administered 2017-07-16: 150 mg via INTRAVENOUS

## 2017-07-16 MED ORDER — DEXAMETHASONE SODIUM PHOSPHATE 4 MG/ML IJ SOLN
INTRAMUSCULAR | Status: DC | PRN
  Administered 2017-07-16: 10 mg via INTRAVENOUS

## 2017-07-16 MED ORDER — ONDANSETRON HCL 4 MG/2ML IJ SOLN
INTRAMUSCULAR | Status: AC
  Filled 2017-07-16: qty 2

## 2017-07-16 MED ORDER — HYDROCODONE-ACETAMINOPHEN 5-325 MG PO TABS: 1.0000 | ORAL_TABLET | Freq: Four times a day (QID) | ORAL | 0 refills | Status: DC | PRN

## 2017-07-16 MED ORDER — 0.9 % SODIUM CHLORIDE (POUR BTL) OPTIME
TOPICAL | Status: DC | PRN
  Administered 2017-07-16: 1000 mL

## 2017-07-16 MED ORDER — DEXAMETHASONE SODIUM PHOSPHATE 10 MG/ML IJ SOLN
INTRAMUSCULAR | Status: AC
  Filled 2017-07-16: qty 1

## 2017-07-16 MED ORDER — KETOROLAC TROMETHAMINE 30 MG/ML IJ SOLN
INTRAMUSCULAR | Status: AC
  Filled 2017-07-16: qty 1

## 2017-07-16 MED ORDER — MIDAZOLAM HCL 5 MG/5ML IJ SOLN
INTRAMUSCULAR | Status: DC | PRN
  Administered 2017-07-16: 2 mg via INTRAVENOUS

## 2017-07-16 MED ORDER — GABAPENTIN 300 MG PO CAPS
ORAL_CAPSULE | ORAL | Status: AC
  Filled 2017-07-16: qty 1

## 2017-07-16 MED ORDER — LIDOCAINE 2% (20 MG/ML) 5 ML SYRINGE
INTRAMUSCULAR | Status: AC
  Filled 2017-07-16: qty 5

## 2017-07-16 MED ORDER — MIDAZOLAM HCL 2 MG/2ML IJ SOLN
INTRAMUSCULAR | Status: AC
  Filled 2017-07-16: qty 2

## 2017-07-16 MED ORDER — FENTANYL CITRATE (PF) 100 MCG/2ML IJ SOLN
INTRAMUSCULAR | Status: DC | PRN
  Administered 2017-07-16: 50 ug via INTRAVENOUS

## 2017-07-16 MED ORDER — LACTATED RINGERS IV SOLN
INTRAVENOUS | Status: DC
  Administered 2017-07-16: 11:00:00 via INTRAVENOUS

## 2017-07-16 MED ORDER — ACETAMINOPHEN 500 MG PO TABS
ORAL_TABLET | ORAL | Status: AC
  Filled 2017-07-16: qty 2

## 2017-07-16 MED ORDER — GABAPENTIN 300 MG PO CAPS
300.0000 mg | ORAL_CAPSULE | ORAL | Status: AC
  Administered 2017-07-16: 300 mg via ORAL

## 2017-07-16 MED ORDER — CEFAZOLIN SODIUM-DEXTROSE 2-4 GM/100ML-% IV SOLN
2.0000 g | INTRAVENOUS | Status: AC
  Administered 2017-07-16: 2 g via INTRAVENOUS

## 2017-07-16 MED ORDER — FENTANYL CITRATE (PF) 100 MCG/2ML IJ SOLN
INTRAMUSCULAR | Status: AC
  Filled 2017-07-16: qty 2

## 2017-07-16 MED ORDER — BUPIVACAINE-EPINEPHRINE (PF) 0.25% -1:200000 IJ SOLN
INTRAMUSCULAR | Status: DC | PRN
Start: 2017-07-16 — End: 2017-07-16
  Administered 2017-07-16: 30 mL

## 2017-07-16 MED ORDER — METOCLOPRAMIDE HCL 5 MG/ML IJ SOLN: 10.0000 mg | Freq: Once | INTRAMUSCULAR | Status: DC | PRN

## 2017-07-16 MED ORDER — LIDOCAINE HCL (CARDIAC) 20 MG/ML IV SOLN
INTRAVENOUS | Status: DC | PRN
  Administered 2017-07-16: 50 mg via INTRAVENOUS

## 2017-07-16 MED ORDER — ONDANSETRON HCL 4 MG/2ML IJ SOLN
INTRAMUSCULAR | Status: DC | PRN
  Administered 2017-07-16: 4 mg via INTRAVENOUS

## 2017-07-16 MED ORDER — LACTATED RINGERS IV SOLN: INTRAVENOUS | Status: DC

## 2017-07-16 MED ORDER — KETOROLAC TROMETHAMINE 30 MG/ML IJ SOLN
INTRAMUSCULAR | Status: DC | PRN
  Administered 2017-07-16: 30 mg via INTRAVENOUS

## 2017-07-16 MED ORDER — MEPERIDINE HCL 25 MG/ML IJ SOLN: 6.2500 mg | INTRAMUSCULAR | Status: DC | PRN

## 2017-07-16 MED ORDER — CEFAZOLIN SODIUM-DEXTROSE 2-4 GM/100ML-% IV SOLN
INTRAVENOUS | Status: AC
  Filled 2017-07-16: qty 100

## 2017-07-16 MED ORDER — FENTANYL CITRATE (PF) 100 MCG/2ML IJ SOLN: 25.0000 ug | INTRAMUSCULAR | Status: DC | PRN

## 2017-07-16 SURGICAL SUPPLY — 60 items
ADH SKN CLS APL DERMABOND .7 (GAUZE/BANDAGES/DRESSINGS) ×1
APL SKNCLS STERI-STRIP NONHPOA (GAUZE/BANDAGES/DRESSINGS)
BENZOIN TINCTURE PRP APPL 2/3 (GAUZE/BANDAGES/DRESSINGS) IMPLANT
BINDER BREAST LRG (GAUZE/BANDAGES/DRESSINGS) ×1 IMPLANT
BINDER BREAST MEDIUM (GAUZE/BANDAGES/DRESSINGS) IMPLANT
BINDER BREAST XLRG (GAUZE/BANDAGES/DRESSINGS) IMPLANT
BINDER BREAST XXLRG (GAUZE/BANDAGES/DRESSINGS) IMPLANT
BLADE HEX COATED 2.75 (ELECTRODE) IMPLANT
BLADE SURG 10 STRL SS (BLADE) ×1 IMPLANT
BLADE SURG 15 STRL LF DISP TIS (BLADE) ×1 IMPLANT
BLADE SURG 15 STRL SS (BLADE) ×2
CANISTER SUC SOCK COL 7IN (MISCELLANEOUS) IMPLANT
CANISTER SUCT 1200ML W/VALVE (MISCELLANEOUS) ×2 IMPLANT
CHLORAPREP W/TINT 26ML (MISCELLANEOUS) ×2 IMPLANT
CLIP VESOCCLUDE SM WIDE 6/CT (CLIP) IMPLANT
COVER BACK TABLE 60X90IN (DRAPES) ×2 IMPLANT
COVER MAYO STAND STRL (DRAPES) ×2 IMPLANT
COVER PROBE W GEL 5X96 (DRAPES) ×1 IMPLANT
DECANTER SPIKE VIAL GLASS SM (MISCELLANEOUS) IMPLANT
DERMABOND ADVANCED (GAUZE/BANDAGES/DRESSINGS) ×1
DERMABOND ADVANCED .7 DNX12 (GAUZE/BANDAGES/DRESSINGS) ×1 IMPLANT
DEVICE DUBIN W/COMP PLATE 8390 (MISCELLANEOUS) ×2 IMPLANT
DRAPE LAPAROSCOPIC ABDOMINAL (DRAPES) ×2 IMPLANT
DRAPE UTILITY XL STRL (DRAPES) ×2 IMPLANT
DRSG PAD ABDOMINAL 8X10 ST (GAUZE/BANDAGES/DRESSINGS) ×1 IMPLANT
ELECT COATED BLADE 2.86 ST (ELECTRODE) ×2 IMPLANT
ELECT REM PT RETURN 9FT ADLT (ELECTROSURGICAL) ×2
ELECTRODE REM PT RTRN 9FT ADLT (ELECTROSURGICAL) ×1 IMPLANT
GAUZE SPONGE 4X4 12PLY STRL LF (GAUZE/BANDAGES/DRESSINGS) IMPLANT
GLOVE BIOGEL PI IND STRL 7.0 (GLOVE) IMPLANT
GLOVE BIOGEL PI IND STRL 7.5 (GLOVE) IMPLANT
GLOVE BIOGEL PI INDICATOR 7.0 (GLOVE) ×2
GLOVE BIOGEL PI INDICATOR 7.5 (GLOVE) ×1
GLOVE SURG SIGNA 7.5 PF LTX (GLOVE) ×3 IMPLANT
GLOVE SURG SS PI 6.5 STRL IVOR (GLOVE) ×2 IMPLANT
GLOVE SURG SS PI 7.0 STRL IVOR (GLOVE) ×1 IMPLANT
GOWN STRL REUS W/ TWL LRG LVL3 (GOWN DISPOSABLE) ×1 IMPLANT
GOWN STRL REUS W/ TWL XL LVL3 (GOWN DISPOSABLE) ×1 IMPLANT
GOWN STRL REUS W/TWL LRG LVL3 (GOWN DISPOSABLE) ×6
GOWN STRL REUS W/TWL XL LVL3 (GOWN DISPOSABLE) ×2
ILLUMINATOR WAVEGUIDE N/F (MISCELLANEOUS) IMPLANT
KIT MARKER MARGIN INK (KITS) ×2 IMPLANT
LIGHT WAVEGUIDE WIDE FLAT (MISCELLANEOUS) IMPLANT
NDL HYPO 25X1 1.5 SAFETY (NEEDLE) ×1 IMPLANT
NEEDLE HYPO 25X1 1.5 SAFETY (NEEDLE) ×2 IMPLANT
NS IRRIG 1000ML POUR BTL (IV SOLUTION) ×1 IMPLANT
PACK BASIN DAY SURGERY FS (CUSTOM PROCEDURE TRAY) ×2 IMPLANT
PENCIL BUTTON HOLSTER BLD 10FT (ELECTRODE) ×2 IMPLANT
PIN SAFETY STERILE (MISCELLANEOUS) IMPLANT
SHEET MEDIUM DRAPE 40X70 STRL (DRAPES) ×2 IMPLANT
SLEEVE SCD COMPRESS KNEE MED (MISCELLANEOUS) ×2 IMPLANT
SPONGE LAP 18X18 X RAY DECT (DISPOSABLE) ×2 IMPLANT
STRIP CLOSURE SKIN 1/4X4 (GAUZE/BANDAGES/DRESSINGS) IMPLANT
SUT MNCRL AB 4-0 PS2 18 (SUTURE) ×2 IMPLANT
SUT VICRYL 3-0 CR8 SH (SUTURE) ×2 IMPLANT
SYR CONTROL 10ML LL (SYRINGE) ×2 IMPLANT
TOWEL OR 17X24 6PK STRL BLUE (TOWEL DISPOSABLE) ×2 IMPLANT
TOWEL OR NON WOVEN STRL DISP B (DISPOSABLE) ×1 IMPLANT
TUBE CONNECTING 20X1/4 (TUBING) IMPLANT
YANKAUER SUCT BULB TIP NO VENT (SUCTIONS) ×2 IMPLANT

## 2017-07-16 NOTE — Transfer of Care (Signed)
Immediate Anesthesia Transfer of Care Note  Patient: [name]  Procedure(s) Performed: Procedure(s) with comments: LEFT BREAST SEED GUIDED BIOPSY (Left) - ERAS PATHWAY  Patient Location: PACU  Anesthesia Type:General  Level of Consciousness: awake and sedated  Airway & Oxygen Therapy: Patient Spontanous Breathing and Patient connected to face mask oxygen  Post-op Assessment: Report given to RN and Post -op Vital signs reviewed and stable  Post vital signs: Reviewed and stable  Last Vitals:  Vitals:   07/16/17 1037 07/16/17 1255  BP: 123/72   Pulse: 60 79  Resp: 16 12  Temp: 36.7 C   SpO2: 99% 99%    Last Pain:  Vitals:   07/16/17 1037  TempSrc: Oral         Complications: No apparent anesthesia complications

## 2017-07-16 NOTE — Anesthesia Postprocedure Evaluation (Signed)
Anesthesia Post Note  Patient: Paticia B Choquette  Procedure(s) Performed: Procedure(s) (LRB): LEFT BREAST SEED GUIDED BIOPSY (Left)     Patient location during evaluation: PACU Anesthesia Type: General Level of consciousness: awake and alert Pain management: pain level controlled Vital Signs Assessment: post-procedure vital signs reviewed and stable Respiratory status: spontaneous breathing, nonlabored ventilation, respiratory function stable and patient connected to nasal cannula oxygen Cardiovascular status: blood pressure returned to baseline and stable Postop Assessment: no apparent nausea or vomiting Anesthetic complications: no    Last Vitals:  Vitals:   07/16/17 1315 07/16/17 1330  BP: 119/75 116/73  Pulse: 63 (!) 55  Resp: 12 12  Temp:    SpO2: 100% 100%    Last Pain:  Vitals:   07/16/17 1330  TempSrc:   PainSc: 0-No pain                 Phillips Grout

## 2017-07-16 NOTE — Interval H&P Note (Signed)
History and Physical Interval Note:  07/16/2017 11:12 AM  [name]  has presented today for surgery, with the diagnosis of left breast csl  The various methods of treatment have been discussed with the patient and family.  Husband at the bedside.  Seed in place.  After consideration of risks, benefits and other options for treatment, the patient has consented to  Procedure(s): LEFT BREAST SEED GUIDED BIOPSY WITH ERAS PATHWAY (Left) as a surgical intervention .  The patient's history has been reviewed, patient examined, no change in status, stable for surgery.  I have reviewed the patient's chart and labs.  Questions were answered to the patient's satisfaction.     [name]

## 2017-07-16 NOTE — Discharge Instructions (Signed)
[facility] - DISCHARGE INSTRUCTIONS TO PATIENT  Return to work on:  Monday, 07/19/2017  Activity:  Driving - May drive in a day or two, if off pain meds   Lifting - No lifting more than 15 pounds for 5 days, then no limits  Wound Care:   Leave bandage for 2 days, then may remove and shower  Diet:  As tolerated  Follow up appointment:  Call Dr. Allene Pyo office [facility]) at [phone] for an appointment in 2 to 3 weeks.  Medications and dosages:  Resume your home medications.  You have a prescription for:  Vicodin  Call Dr. Ezzard Standing or his office  [phone]) if you have:  Temperature greater than 100.4,  Persistent nausea and vomiting,  Severe uncontrolled pain,  Redness, tenderness, or signs of infection (pain, swelling, redness, odor or green/yellow discharge around the site),  Difficulty breathing, headache or visual disturbances,  Any other questions or concerns you may have after discharge.  In an emergency, call 911 or go to an Emergency Department at a nearby hospital.    Post Anesthesia Home Care Instructions  Activity: Get plenty of rest for the remainder of the day. A responsible individual must stay with you for 24 hours following the procedure.  For the next 24 hours, DO NOT: -Drive a car -Advertising copywriter -Drink alcoholic beverages -Take any medication unless instructed by your physician -Make any legal decisions or sign important papers.  Meals: Start with liquid foods such as gelatin or soup. Progress to regular foods as tolerated. Avoid greasy, spicy, heavy foods. If nausea and/or vomiting occur, drink only clear liquids until the nausea and/or vomiting subsides. Call your physician if vomiting continues.  Special Instructions/Symptoms: Your throat may feel dry or sore from the anesthesia or the breathing tube placed in your throat during surgery. If this causes discomfort, gargle with warm salt water. The discomfort  should disappear within 24 hours.  If you had a scopolamine patch placed behind your ear for the management of post- operative nausea and/or vomiting:  1. The medication in the patch is effective for 72 hours, after which it should be removed.  Wrap patch in a tissue and discard in the trash. Wash hands thoroughly with soap and water. 2. You may remove the patch earlier than 72 hours if you experience unpleasant side effects which may include dry mouth, dizziness or visual disturbances. 3. Avoid touching the patch. Wash your hands with soap and water after contact with the patch.

## 2017-07-16 NOTE — Anesthesia Procedure Notes (Signed)
Procedure Name: LMA Insertion Date/Time: 07/16/2017 10:56 AM Performed by: Zenia Resides D Pre-anesthesia Checklist: Patient identified, Emergency Drugs available, Suction available and Patient being monitored Patient Re-evaluated:Patient Re-evaluated prior to induction Oxygen Delivery Method: Circle system utilized Preoxygenation: Pre-oxygenation with 100% oxygen Induction Type: IV induction Ventilation: Mask ventilation without difficulty LMA: LMA inserted LMA Size: 4.0 Number of attempts: 1 Airway Equipment and Method: Bite block Placement Confirmation: positive ETCO2 Tube secured with: Tape Dental Injury: Teeth and Oropharynx as per pre-operative assessment

## 2017-07-16 NOTE — Anesthesia Preprocedure Evaluation (Signed)
Anesthesia Evaluation  Patient identified by MRN, date of birth, ID band Patient awake    History of Anesthesia Complications (+) PONV, DIFFICULT AIRWAY and history of anesthetic complications  Airway Mallampati: II  TM Distance: >3 FB Neck ROM: Full    Dental no notable dental hx. (+) Teeth Intact, Dental Advisory Given   Pulmonary neg pulmonary ROS,    Pulmonary exam normal breath sounds clear to auscultation       Cardiovascular Normal cardiovascular exam Rhythm:Regular Rate:Normal     Neuro/Psych    GI/Hepatic   Endo/Other  diabetes  Renal/GU      Musculoskeletal   Abdominal   Peds  Hematology   Anesthesia Other Findings   Reproductive/Obstetrics                             Anesthesia Physical  Anesthesia Plan  ASA: I  Anesthesia Plan: General   Post-op Pain Management:    Induction: Intravenous  PONV Risk Score and Plan:   Airway Management Planned: LMA  Additional Equipment:   Intra-op Plan:   Post-operative Plan: Extubation in OR  Informed Consent: I have reviewed the patients History and Physical, chart, labs and discussed the procedure including the risks, benefits and alternatives for the proposed anesthesia with the patient or authorized representative who has indicated his/her understanding and acceptance.   Dental advisory given  Plan Discussed with: CRNA, Anesthesiologist and Surgeon  Anesthesia Plan Comments:         Anesthesia Quick Evaluation

## 2017-07-16 NOTE — Op Note (Signed)
07/16/2017  12:47 PM  PATIENT:  [name] DOB: [birth year] MRN: [id number]  PREOP DIAGNOSIS:   left breast, complex sclerosing lesion, 3 o'clock  POSTOP DIAGNOSIS:    left breast, complex sclerosing lesion, 3 o'clock [final pathology is pending]  PROCEDURE:   Procedure(s): LEFT BREAST SEED GUIDED BIOPSY  SURGEON:   Alphonsa Overall, M.D.  ANESTHESIA:   general  Anesthesiologist: Montez Hageman, MD CRNA: Willa Frater, CRNA; Terrance Mass, CRNA  General  EBL:  minimal  ml  DRAINS:  none   LOCAL MEDICATIONS USED:   30 cc 1/4% marcaine  SPECIMEN:   Left breast biopsy - painted with 6 color paint kit  COUNTS CORRECT:  YES  INDICATIONS FOR PROCEDURE:  [name] is a 56 y.o. (DOB: [birth year]) white female whose primary care physician is Lavone Orn, MD and comes for left breast biopsy.   She underwent bilateral breast biopsied on 21 May 2017.  The biopsy in the lower outer quadrant of the left breast shows a complex sclerosing lesion.  She now comes for a biopsy of this area.   The indications and potential complications of surgery were explained to the patient. Potential complications include, but are not limited to, bleeding, infection, the need for further surgery, and nerve injury.     She had a I131 seed placed on 07/14/2017 in her left breast at [facility].  I confirmed the presence of the I131 seed in the pre op area using the Neoprobe.  The seed is in the 3 o'clock position of the left breast.     OPERATIVE NOTE:   The patient was taken to room # 1 at [facility] where she underwent a general anesthesia  supervised by Anesthesiologist: Montez Hageman, MD CRNA: Willa Frater, CRNA; Terrance Mass, CRNA. Her left breast and axilla were prepped with  ChloraPrep and sterilely draped.    A time-out and the surgical check list was reviewed.    The complex sclerosing lesion was about at the 3 o'clock position of the left breast, 2 to 3 cm lateral to  the areola.  I made a circumareolar incision to get down to the lesion.  I used the Neoprobe to identify the I131 seed.  I tried to excise an area around the tumor of at least 1 cm.    I excised this block of breast tissue approximately 3 cm by 3 cm  in diameter.   I painted the lumpectomy specimen with the 6 color paint kit and did a specimen mammogram which confirmed the mass, clip, and the seed were all in the right position in the specimen.  The specimen was sent to pathology who called back to confirm that they have the seed and the specimen.   I then irrigated the wound with saline. I infiltrated approximately 30 mL of 1/4% Marcaine in the incision. Because this is benign, I did not place clips.   I then closed all the wounds in layers using 3-0 Vicryl sutures for the deep layer. At the skin, I closed the incisions with a 4-0 Monocryl suture. The incisions were then painted with Dermabond.  She had gauze place over the wounds and placed in a breast binder.   The patient tolerated the procedure well, was transported to the recovery room in good condition. Sponge and needle count were correct at the end of the case.   Final pathology is pending.   Alphonsa Overall, MD, [facility]  Pager: [phone] Office phone:  [phone]

## 2017-07-19 ENCOUNTER — Encounter (HOSPITAL_BASED_OUTPATIENT_CLINIC_OR_DEPARTMENT_OTHER): Payer: Self-pay | Admitting: Surgery

## 2017-08-25 DIAGNOSIS — E782 Mixed hyperlipidemia: Secondary | ICD-10-CM | POA: Diagnosis not present

## 2017-08-25 DIAGNOSIS — E119 Type 2 diabetes mellitus without complications: Secondary | ICD-10-CM | POA: Diagnosis not present

## 2017-08-25 DIAGNOSIS — Z23 Encounter for immunization: Secondary | ICD-10-CM | POA: Diagnosis not present

## 2017-08-25 DIAGNOSIS — Z0000 Encounter for general adult medical examination without abnormal findings: Secondary | ICD-10-CM | POA: Diagnosis not present

## 2017-08-30 DIAGNOSIS — Q078 Other specified congenital malformations of nervous system: Secondary | ICD-10-CM | POA: Diagnosis not present

## 2017-08-30 DIAGNOSIS — H2513 Age-related nuclear cataract, bilateral: Secondary | ICD-10-CM | POA: Diagnosis not present

## 2017-08-30 DIAGNOSIS — Q142 Congenital malformation of optic disc: Secondary | ICD-10-CM | POA: Diagnosis not present

## 2017-08-30 DIAGNOSIS — Q079 Congenital malformation of nervous system, unspecified: Secondary | ICD-10-CM | POA: Diagnosis not present

## 2017-08-30 DIAGNOSIS — Z9889 Other specified postprocedural states: Secondary | ICD-10-CM | POA: Diagnosis not present

## 2017-12-07 ENCOUNTER — Other Ambulatory Visit: Payer: Self-pay | Admitting: Internal Medicine

## 2017-12-07 ENCOUNTER — Ambulatory Visit
Admission: RE | Admit: 2017-12-07 | Discharge: 2017-12-07 | Disposition: A | Discharge status: Home / Self Care | Payer: 59 | Source: Ambulatory Visit | Attending: Internal Medicine | Admitting: Internal Medicine

## 2017-12-07 DIAGNOSIS — S4992XA Unspecified injury of left shoulder and upper arm, initial encounter: Secondary | ICD-10-CM | POA: Diagnosis not present

## 2017-12-07 DIAGNOSIS — M25512 Pain in left shoulder: Secondary | ICD-10-CM | POA: Diagnosis not present

## 2017-12-07 DIAGNOSIS — R52 Pain, unspecified: Secondary | ICD-10-CM

## 2017-12-23 DIAGNOSIS — M67912 Unspecified disorder of synovium and tendon, left shoulder: Secondary | ICD-10-CM | POA: Diagnosis not present

## 2018-01-13 DIAGNOSIS — M25512 Pain in left shoulder: Secondary | ICD-10-CM | POA: Diagnosis not present

## 2018-01-13 DIAGNOSIS — M67912 Unspecified disorder of synovium and tendon, left shoulder: Secondary | ICD-10-CM | POA: Diagnosis not present

## 2018-01-24 DIAGNOSIS — H5340 Unspecified visual field defects: Secondary | ICD-10-CM | POA: Diagnosis not present

## 2018-02-07 DIAGNOSIS — E782 Mixed hyperlipidemia: Secondary | ICD-10-CM | POA: Diagnosis not present

## 2018-02-07 DIAGNOSIS — E119 Type 2 diabetes mellitus without complications: Secondary | ICD-10-CM | POA: Diagnosis not present

## 2018-02-16 DIAGNOSIS — S42255D Nondisplaced fracture of greater tuberosity of left humerus, subsequent encounter for fracture with routine healing: Secondary | ICD-10-CM | POA: Diagnosis not present

## 2018-02-16 DIAGNOSIS — M25612 Stiffness of left shoulder, not elsewhere classified: Secondary | ICD-10-CM | POA: Diagnosis not present

## 2018-02-23 DIAGNOSIS — S42255D Nondisplaced fracture of greater tuberosity of left humerus, subsequent encounter for fracture with routine healing: Secondary | ICD-10-CM | POA: Diagnosis not present

## 2018-02-23 DIAGNOSIS — M25512 Pain in left shoulder: Secondary | ICD-10-CM | POA: Diagnosis not present

## 2018-02-23 DIAGNOSIS — M25612 Stiffness of left shoulder, not elsewhere classified: Secondary | ICD-10-CM | POA: Diagnosis not present

## 2018-03-02 DIAGNOSIS — S42255D Nondisplaced fracture of greater tuberosity of left humerus, subsequent encounter for fracture with routine healing: Secondary | ICD-10-CM | POA: Diagnosis not present

## 2018-03-02 DIAGNOSIS — M25612 Stiffness of left shoulder, not elsewhere classified: Secondary | ICD-10-CM | POA: Diagnosis not present

## 2018-03-02 DIAGNOSIS — M25512 Pain in left shoulder: Secondary | ICD-10-CM | POA: Diagnosis not present

## 2018-03-09 DIAGNOSIS — M25612 Stiffness of left shoulder, not elsewhere classified: Secondary | ICD-10-CM | POA: Diagnosis not present

## 2018-03-09 DIAGNOSIS — M25512 Pain in left shoulder: Secondary | ICD-10-CM | POA: Diagnosis not present

## 2018-03-09 DIAGNOSIS — S42255D Nondisplaced fracture of greater tuberosity of left humerus, subsequent encounter for fracture with routine healing: Secondary | ICD-10-CM | POA: Diagnosis not present

## 2018-03-16 DIAGNOSIS — M25612 Stiffness of left shoulder, not elsewhere classified: Secondary | ICD-10-CM | POA: Diagnosis not present

## 2018-03-16 DIAGNOSIS — S42255D Nondisplaced fracture of greater tuberosity of left humerus, subsequent encounter for fracture with routine healing: Secondary | ICD-10-CM | POA: Diagnosis not present

## 2018-03-16 DIAGNOSIS — M25512 Pain in left shoulder: Secondary | ICD-10-CM | POA: Diagnosis not present

## 2018-04-07 DIAGNOSIS — Z808 Family history of malignant neoplasm of other organs or systems: Secondary | ICD-10-CM | POA: Diagnosis not present

## 2018-04-07 DIAGNOSIS — Z1231 Encounter for screening mammogram for malignant neoplasm of breast: Secondary | ICD-10-CM | POA: Diagnosis not present

## 2018-04-07 DIAGNOSIS — Z6831 Body mass index (BMI) 31.0-31.9, adult: Secondary | ICD-10-CM | POA: Diagnosis not present

## 2018-04-07 DIAGNOSIS — Z803 Family history of malignant neoplasm of breast: Secondary | ICD-10-CM | POA: Diagnosis not present

## 2018-04-07 DIAGNOSIS — Z01419 Encounter for gynecological examination (general) (routine) without abnormal findings: Secondary | ICD-10-CM | POA: Diagnosis not present

## 2018-04-13 ENCOUNTER — Other Ambulatory Visit: Payer: Self-pay | Admitting: Obstetrics and Gynecology

## 2018-04-13 DIAGNOSIS — Z803 Family history of malignant neoplasm of breast: Secondary | ICD-10-CM

## 2018-05-26 DIAGNOSIS — Z809 Family history of malignant neoplasm, unspecified: Secondary | ICD-10-CM | POA: Diagnosis not present

## 2018-06-13 ENCOUNTER — Ambulatory Visit
Admission: RE | Admit: 2018-06-13 | Discharge: 2018-06-13 | Disposition: A | Discharge status: Home / Self Care | Payer: 59 | Source: Ambulatory Visit | Attending: Obstetrics and Gynecology | Admitting: Obstetrics and Gynecology

## 2018-06-13 DIAGNOSIS — Z803 Family history of malignant neoplasm of breast: Secondary | ICD-10-CM

## 2018-06-13 MED ORDER — GADOBENATE DIMEGLUMINE 529 MG/ML IV SOLN
15.0000 mL | Freq: Once | INTRAVENOUS | Status: AC | PRN
  Administered 2018-06-13: 15 mL via INTRAVENOUS

## 2018-08-23 DIAGNOSIS — M79641 Pain in right hand: Secondary | ICD-10-CM | POA: Diagnosis not present

## 2018-08-26 DIAGNOSIS — Z23 Encounter for immunization: Secondary | ICD-10-CM | POA: Diagnosis not present

## 2018-08-26 DIAGNOSIS — E119 Type 2 diabetes mellitus without complications: Secondary | ICD-10-CM | POA: Diagnosis not present

## 2018-08-26 DIAGNOSIS — E782 Mixed hyperlipidemia: Secondary | ICD-10-CM | POA: Diagnosis not present

## 2018-08-26 DIAGNOSIS — Z0000 Encounter for general adult medical examination without abnormal findings: Secondary | ICD-10-CM | POA: Diagnosis not present

## 2018-08-26 DIAGNOSIS — R42 Dizziness and giddiness: Secondary | ICD-10-CM | POA: Diagnosis not present

## 2018-10-06 DIAGNOSIS — E0840 Diabetes mellitus due to underlying condition with diabetic neuropathy, unspecified: Secondary | ICD-10-CM | POA: Insufficient documentation

## 2018-12-30 ENCOUNTER — Other Ambulatory Visit: Payer: Self-pay | Admitting: Obstetrics and Gynecology

## 2018-12-30 DIAGNOSIS — Z803 Family history of malignant neoplasm of breast: Secondary | ICD-10-CM

## 2019-01-06 ENCOUNTER — Other Ambulatory Visit: Payer: Self-pay | Admitting: Obstetrics and Gynecology

## 2019-01-09 ENCOUNTER — Other Ambulatory Visit: Payer: 59

## 2019-03-02 DIAGNOSIS — E1169 Type 2 diabetes mellitus with other specified complication: Secondary | ICD-10-CM | POA: Diagnosis not present

## 2019-03-08 DIAGNOSIS — E1169 Type 2 diabetes mellitus with other specified complication: Secondary | ICD-10-CM | POA: Diagnosis not present

## 2019-04-28 ENCOUNTER — Ambulatory Visit
Admission: RE | Admit: 2019-04-28 | Discharge: 2019-04-28 | Disposition: A | Discharge status: Home / Self Care | Payer: 59 | Source: Ambulatory Visit | Attending: Obstetrics and Gynecology | Admitting: Obstetrics and Gynecology

## 2019-04-28 DIAGNOSIS — Z803 Family history of malignant neoplasm of breast: Secondary | ICD-10-CM

## 2019-04-28 MED ORDER — GADOBUTROL 1 MMOL/ML IV SOLN
8.0000 mL | Freq: Once | INTRAVENOUS | Status: AC | PRN
  Administered 2019-04-28: 8 mL via INTRAVENOUS

## 2019-11-12 ENCOUNTER — Ambulatory Visit: Payer: 59 | Attending: Internal Medicine

## 2019-11-12 DIAGNOSIS — Z23 Encounter for immunization: Secondary | ICD-10-CM | POA: Insufficient documentation

## 2019-11-12 NOTE — Progress Notes (Signed)
   Covid-19 Vaccination Clinic  Name:  [name]    MRN: [id number] DOB: May 01, 1961  11/12/2019  [name] was observed post Covid-19 immunization for 15 minutes without incidence. She was provided with Vaccine Information Sheet and instruction to access the V-Safe system.   [name] was instructed to call 911 with any severe reactions post vaccine: Marland Kitchen Difficulty breathing  . Swelling of your face and throat  . A fast heartbeat  . A bad rash all over your body  . Dizziness and weakness    Immunizations Administered    Name Date Dose VIS Date Route   Pfizer COVID-19 Vaccine 11/12/2019  1:32 PM 0.3 mL 10/13/2019 Intramuscular   Manufacturer: [employer], [employer]   Lot: [id number]   NDC: [address]-9

## 2019-12-03 ENCOUNTER — Ambulatory Visit: Payer: BC Managed Care – PPO | Attending: Internal Medicine

## 2019-12-03 DIAGNOSIS — Z23 Encounter for immunization: Secondary | ICD-10-CM | POA: Insufficient documentation

## 2019-12-03 NOTE — Progress Notes (Signed)
   Covid-19 Vaccination Clinic  Name:  [name]    MRN: [id number] DOB: [birth year]  12/03/2019  [name] was observed post Covid-19 immunization for 15 minutes without incidence. She was provided with Vaccine Information Sheet and instruction to access the V-Safe system.   [name] was instructed to call 911 with any severe reactions post vaccine: Marland Kitchen Difficulty breathing  . Swelling of your face and throat  . A fast heartbeat  . A bad rash all over your body  . Dizziness and weakness    Immunizations Administered    Name Date Dose VIS Date Route   Pfizer COVID-19 Vaccine 12/03/2019 11:52 AM 0.3 mL 10/13/2019 Intramuscular   Manufacturer: [employer], [employer]   Lot: [id number]   NDC: [address]-3

## 2020-02-29 DIAGNOSIS — E1169 Type 2 diabetes mellitus with other specified complication: Secondary | ICD-10-CM | POA: Diagnosis not present

## 2020-02-29 DIAGNOSIS — Z23 Encounter for immunization: Secondary | ICD-10-CM | POA: Diagnosis not present

## 2020-05-17 DIAGNOSIS — Z1231 Encounter for screening mammogram for malignant neoplasm of breast: Secondary | ICD-10-CM | POA: Diagnosis not present

## 2020-05-22 ENCOUNTER — Other Ambulatory Visit: Payer: Self-pay | Admitting: Obstetrics and Gynecology

## 2020-05-22 DIAGNOSIS — Z803 Family history of malignant neoplasm of breast: Secondary | ICD-10-CM

## 2020-06-07 ENCOUNTER — Ambulatory Visit
Admission: RE | Admit: 2020-06-07 | Discharge: 2020-06-07 | Disposition: A | Discharge status: Home / Self Care | Payer: BC Managed Care – PPO | Source: Ambulatory Visit | Attending: Obstetrics and Gynecology | Admitting: Obstetrics and Gynecology

## 2020-06-07 DIAGNOSIS — N6489 Other specified disorders of breast: Secondary | ICD-10-CM | POA: Diagnosis not present

## 2020-06-07 DIAGNOSIS — Z803 Family history of malignant neoplasm of breast: Secondary | ICD-10-CM | POA: Diagnosis not present

## 2020-06-07 MED ORDER — GADOBUTROL 1 MMOL/ML IV SOLN
6.0000 mL | Freq: Once | INTRAVENOUS | Status: AC | PRN
  Administered 2020-06-07: 6 mL via INTRAVENOUS

## 2020-08-17 ENCOUNTER — Ambulatory Visit: Payer: BC Managed Care – PPO | Attending: Internal Medicine

## 2020-08-17 DIAGNOSIS — Z23 Encounter for immunization: Secondary | ICD-10-CM

## 2020-08-17 NOTE — Progress Notes (Signed)
° °  Covid-19 Vaccination Clinic  Name:  [name]    MRN: [id number] DOB: [birth year]  08/17/2020  [name] was observed post Covid-19 immunization for 15 minutes without incident. She was provided with Vaccine Information Sheet and instruction to access the V-Safe system.   [name] was instructed to call 911 with any severe reactions post vaccine:  Difficulty breathing   Swelling of face and throat   A fast heartbeat   A bad rash all over body   Dizziness and weakness

## 2020-08-30 ENCOUNTER — Ambulatory Visit: Payer: BC Managed Care – PPO

## 2020-09-03 DIAGNOSIS — Z23 Encounter for immunization: Secondary | ICD-10-CM | POA: Diagnosis not present

## 2020-09-03 DIAGNOSIS — E1169 Type 2 diabetes mellitus with other specified complication: Secondary | ICD-10-CM | POA: Diagnosis not present

## 2020-09-03 DIAGNOSIS — Z0000 Encounter for general adult medical examination without abnormal findings: Secondary | ICD-10-CM | POA: Diagnosis not present

## 2020-09-03 DIAGNOSIS — E782 Mixed hyperlipidemia: Secondary | ICD-10-CM | POA: Diagnosis not present

## 2020-09-03 DIAGNOSIS — R351 Nocturia: Secondary | ICD-10-CM | POA: Diagnosis not present

## 2020-09-03 DIAGNOSIS — Z1159 Encounter for screening for other viral diseases: Secondary | ICD-10-CM | POA: Diagnosis not present

## 2020-09-09 DIAGNOSIS — Z01419 Encounter for gynecological examination (general) (routine) without abnormal findings: Secondary | ICD-10-CM | POA: Diagnosis not present

## 2020-09-09 DIAGNOSIS — E281 Androgen excess: Secondary | ICD-10-CM | POA: Diagnosis not present

## 2020-09-09 DIAGNOSIS — Z6831 Body mass index (BMI) 31.0-31.9, adult: Secondary | ICD-10-CM | POA: Diagnosis not present

## 2020-10-31 DIAGNOSIS — Z13820 Encounter for screening for osteoporosis: Secondary | ICD-10-CM | POA: Diagnosis not present

## 2021-01-20 DIAGNOSIS — L718 Other rosacea: Secondary | ICD-10-CM | POA: Diagnosis not present

## 2021-01-30 IMAGING — MR MR BILATERAL BREAST WITHOUT AND WITH CONTRAST
8 of 12 series · 30 of 48 positions shown · IV contrast (8 ml gadavist)
Comparison: Prior exams including MRI breast 05/18/2017.

CLINICAL DATA: Patient with elevated lifetime risk of breast
cancer. History surgical removal complex sclerosing region left
breast 9955.

EXAM:
BILATERAL BREAST MRI WITH AND WITHOUT CONTRAST
TECHNIQUE: Multiplanar, multisequence MR images of both breasts were obtained
prior to and following the intravenous administration of 8 ml of
Gadavist

[Series 2: t2_tirm_tra ipat (a-p) · axial · 3.0mm · 0.70mm/px · 1 of 65 slices shown]
[im 1/65]
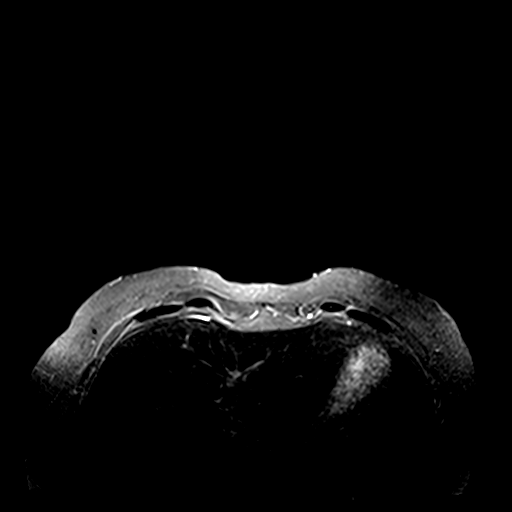

[Series 3: fl3d pre-cm no · axial · non-contrast · 0.9mm · 0.94mm/px · z∈[-99,+102]mm · 5 of 224 slices shown]
[im 1/224]
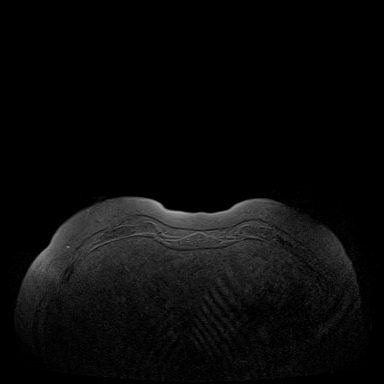
[im 56/224]
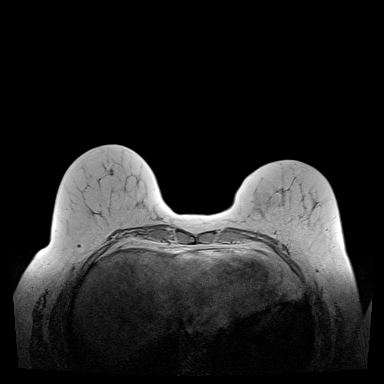
[im 112/224]
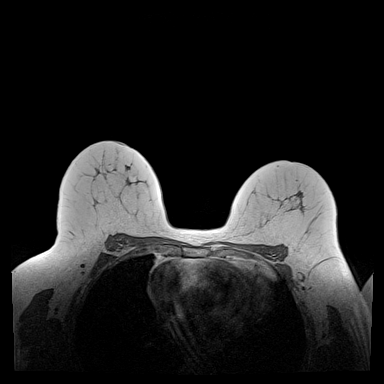
[im 168/224]
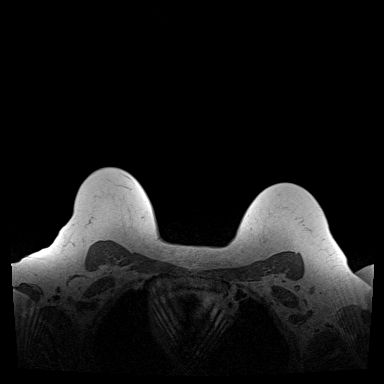
[im 224/224]
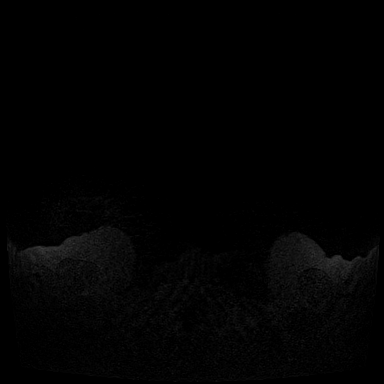

[Series 4: fl3d pre-cm · axial · non-contrast · 0.9mm · 0.87mm/px · z∈[-99,+102]mm · 5 of 224 slices shown]
[im 1/224]
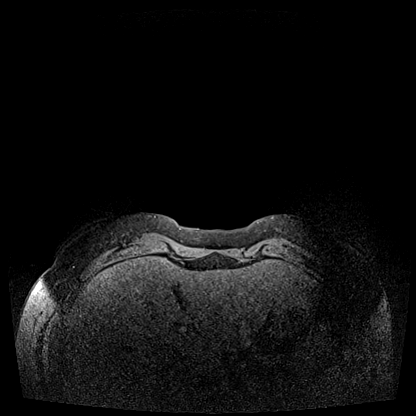
[im 56/224]
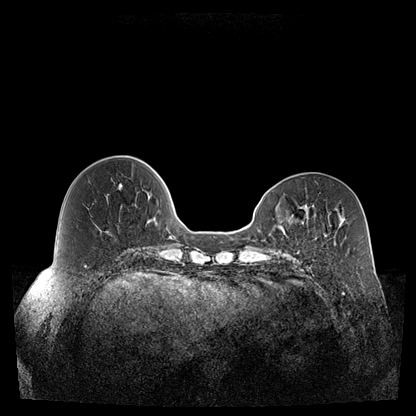
[im 112/224]
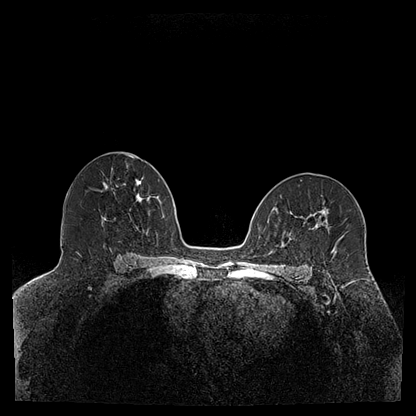
[im 168/224]
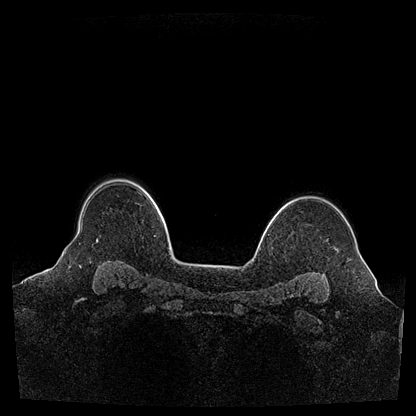
[im 224/224]
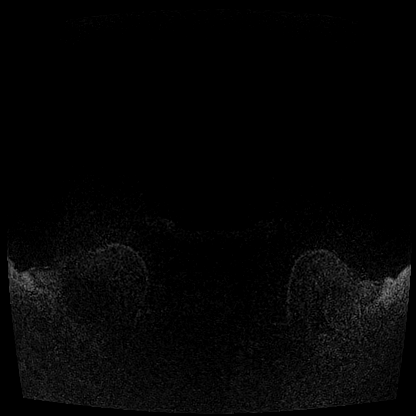

[Series 5: fl3d post-cm 20 · axial · 0.9mm · 0.87mm/px · z∈[-99,+102]mm · 5 of 224 slices shown (1 of 3)]
[im 1/224]
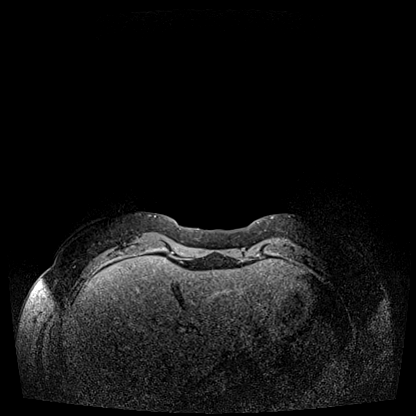
[im 56/224]
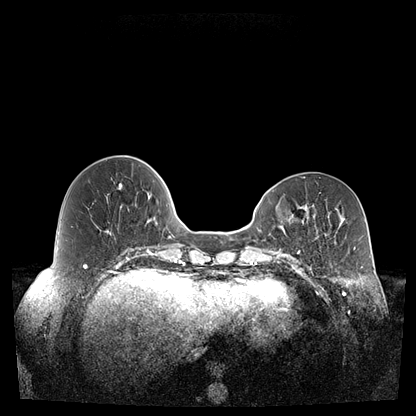
[im 112/224]
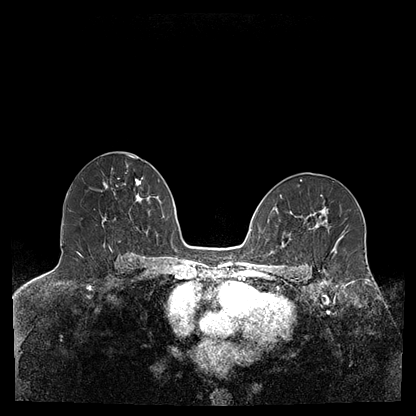
[im 168/224]
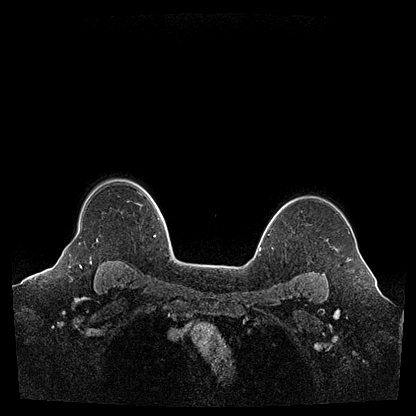
[im 224/224]
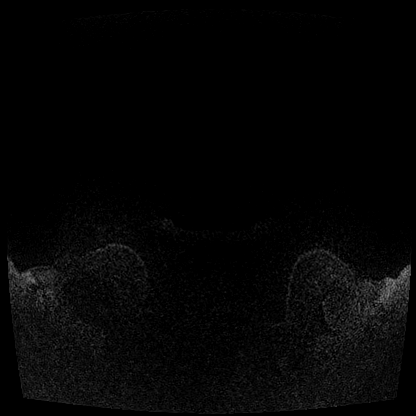

[Series 6: fl3d post-cm 20 · axial · 0.9mm · 0.87mm/px · z∈[-99,+102]mm · 5 of 224 slices shown (2 of 3)]
[im 1/224]
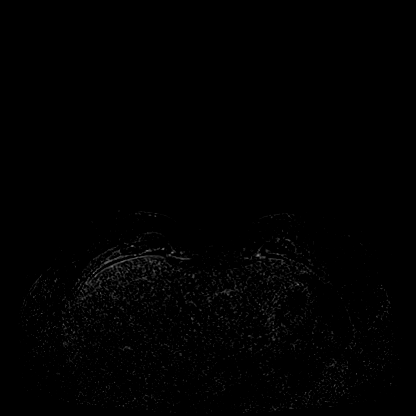
[im 56/224]
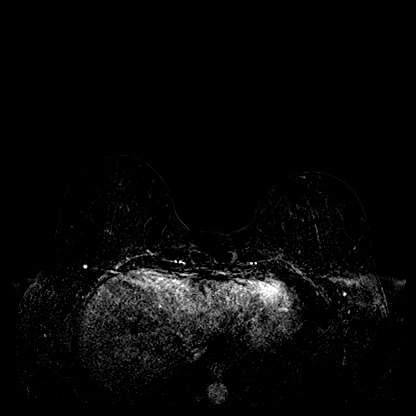
[im 112/224]
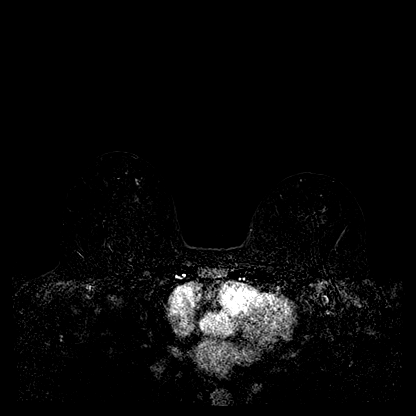
[im 168/224]
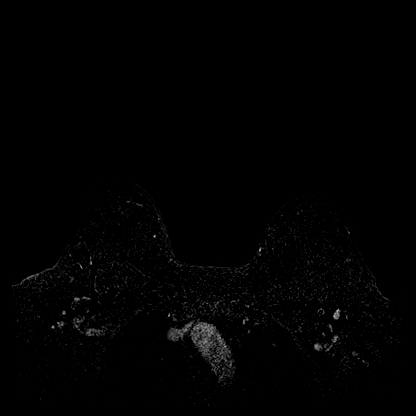
[im 224/224]
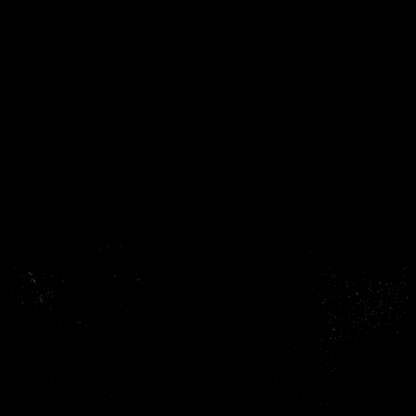

[Series 7: fl3d post-cm 20 · axial · 201.6mm · 0.87mm/px · 1 of 1 slices shown (3 of 3)]
[im 1/1]
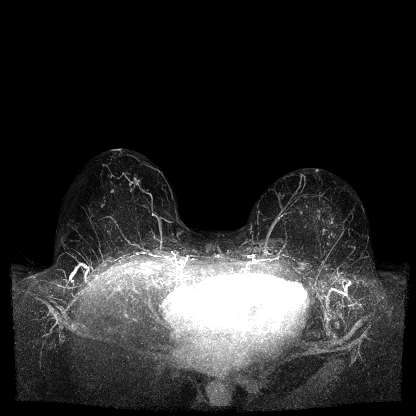

[Series 8: fl3d post-cm 3min · axial · 0.9mm · 0.87mm/px · z∈[-99,+102]mm · 6 of 224 slices shown]
[im 1/224]
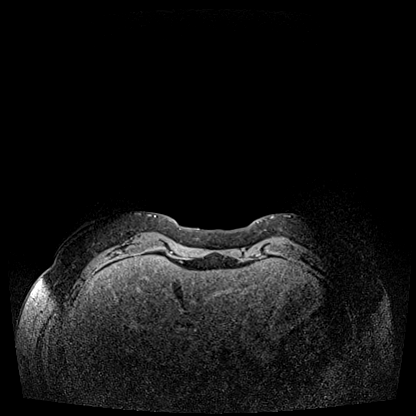
[im 45/224]
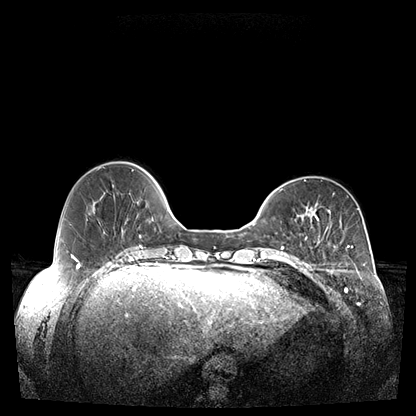
[im 90/224]
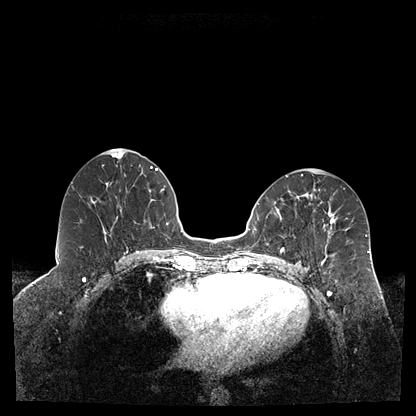
[im 134/224]
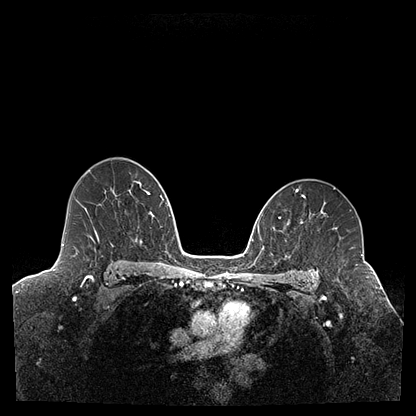
[im 179/224]
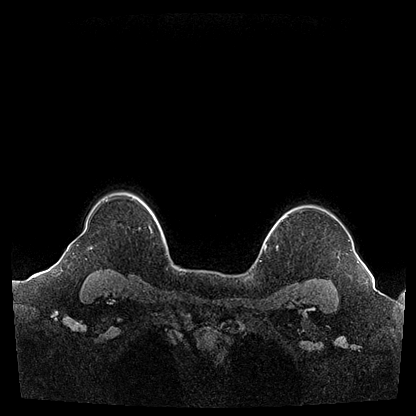
[im 224/224]
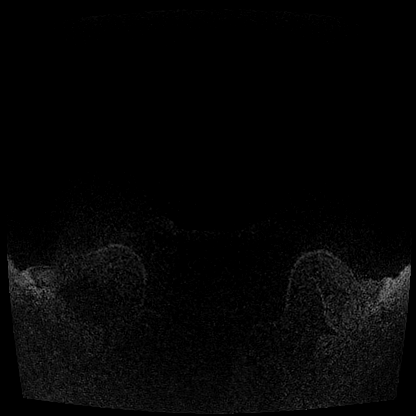

[Series 9: fl3d post-cm 3min_sub · axial · 0.9mm · 0.87mm/px · z∈[-99,-59]mm · 2 of 224 slices shown]
[im 1/224]
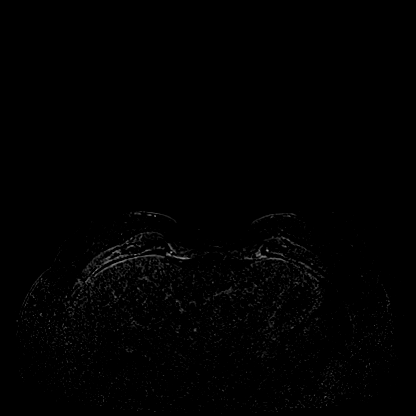
[im 45/224]
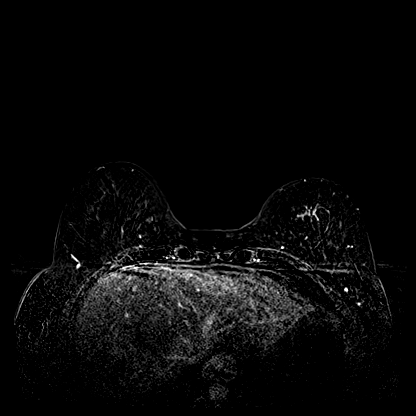

[30 of 48 positions shown; findings below may reference images not displayed]

Three-dimensional MR images were rendered by post-processing of the
original MR data on an independent workstation. The
three-dimensional MR images were interpreted, and findings are
reported in the following complete MRI report for this study. Three
dimensional images were evaluated at the independent [employer]
workstation
FINDINGS: Breast composition: b. Scattered fibroglandular tissue.

Background parenchymal enhancement: Minimal

Right breast: Biopsy marking clip within the upper inner right
breast. No suspicious mass or enhancement identified within the
right breast.

Left breast: Postsurgical changes within the inferior aspect of the
left breast. No suspicious mass or enhancement identified within the
left breast.

Lymph nodes: No abnormal appearing lymph nodes.

Ancillary findings:  None.
IMPRESSION: No MRI evidence to suggest malignancy within the left or right
breast.

RECOMMENDATION:
Annual high risk screening MRI.

Annual screening mammography.

BI-RADS CATEGORY  2: Benign.

## 2021-03-04 DIAGNOSIS — M25532 Pain in left wrist: Secondary | ICD-10-CM | POA: Diagnosis not present

## 2021-03-04 DIAGNOSIS — M545 Low back pain, unspecified: Secondary | ICD-10-CM | POA: Diagnosis not present

## 2021-03-04 DIAGNOSIS — H9193 Unspecified hearing loss, bilateral: Secondary | ICD-10-CM | POA: Diagnosis not present

## 2021-03-04 DIAGNOSIS — R030 Elevated blood-pressure reading, without diagnosis of hypertension: Secondary | ICD-10-CM | POA: Diagnosis not present

## 2021-03-04 DIAGNOSIS — E1169 Type 2 diabetes mellitus with other specified complication: Secondary | ICD-10-CM | POA: Diagnosis not present

## 2021-03-18 DIAGNOSIS — E119 Type 2 diabetes mellitus without complications: Secondary | ICD-10-CM | POA: Diagnosis not present

## 2021-03-18 DIAGNOSIS — H5203 Hypermetropia, bilateral: Secondary | ICD-10-CM | POA: Diagnosis not present

## 2021-03-18 DIAGNOSIS — H524 Presbyopia: Secondary | ICD-10-CM | POA: Diagnosis not present

## 2021-03-18 DIAGNOSIS — H52203 Unspecified astigmatism, bilateral: Secondary | ICD-10-CM | POA: Diagnosis not present

## 2021-05-19 DIAGNOSIS — Z1231 Encounter for screening mammogram for malignant neoplasm of breast: Secondary | ICD-10-CM | POA: Diagnosis not present

## 2021-05-22 ENCOUNTER — Other Ambulatory Visit: Payer: Self-pay | Admitting: Obstetrics and Gynecology

## 2021-05-22 DIAGNOSIS — Z803 Family history of malignant neoplasm of breast: Secondary | ICD-10-CM

## 2021-06-27 ENCOUNTER — Ambulatory Visit
Admission: RE | Admit: 2021-06-27 | Discharge: 2021-06-27 | Disposition: A | Discharge status: Home / Self Care | Payer: BC Managed Care – PPO | Source: Ambulatory Visit | Attending: Obstetrics and Gynecology | Admitting: Obstetrics and Gynecology

## 2021-06-27 ENCOUNTER — Other Ambulatory Visit: Payer: Self-pay

## 2021-06-27 DIAGNOSIS — N62 Hypertrophy of breast: Secondary | ICD-10-CM | POA: Diagnosis not present

## 2021-06-27 DIAGNOSIS — Z803 Family history of malignant neoplasm of breast: Secondary | ICD-10-CM

## 2021-06-27 MED ORDER — GADOBUTROL 1 MMOL/ML IV SOLN
8.0000 mL | Freq: Once | INTRAVENOUS | Status: AC | PRN
  Administered 2021-06-27: 8 mL via INTRAVENOUS

## 2021-09-08 DIAGNOSIS — E1169 Type 2 diabetes mellitus with other specified complication: Secondary | ICD-10-CM | POA: Diagnosis not present

## 2021-09-08 DIAGNOSIS — K5909 Other constipation: Secondary | ICD-10-CM | POA: Diagnosis not present

## 2021-09-08 DIAGNOSIS — E782 Mixed hyperlipidemia: Secondary | ICD-10-CM | POA: Diagnosis not present

## 2021-09-08 DIAGNOSIS — Z0000 Encounter for general adult medical examination without abnormal findings: Secondary | ICD-10-CM | POA: Diagnosis not present

## 2021-09-29 DIAGNOSIS — D2239 Melanocytic nevi of other parts of face: Secondary | ICD-10-CM | POA: Diagnosis not present

## 2021-09-29 DIAGNOSIS — L718 Other rosacea: Secondary | ICD-10-CM | POA: Diagnosis not present

## 2021-10-08 DIAGNOSIS — Z87410 Personal history of cervical dysplasia: Secondary | ICD-10-CM | POA: Insufficient documentation

## 2021-10-08 DIAGNOSIS — E119 Type 2 diabetes mellitus without complications: Secondary | ICD-10-CM | POA: Insufficient documentation

## 2021-10-09 DIAGNOSIS — Z6830 Body mass index (BMI) 30.0-30.9, adult: Secondary | ICD-10-CM | POA: Diagnosis not present

## 2021-10-09 DIAGNOSIS — Z01419 Encounter for gynecological examination (general) (routine) without abnormal findings: Secondary | ICD-10-CM | POA: Diagnosis not present

## 2021-12-29 DIAGNOSIS — E1169 Type 2 diabetes mellitus with other specified complication: Secondary | ICD-10-CM | POA: Diagnosis not present

## 2022-03-11 DIAGNOSIS — E1169 Type 2 diabetes mellitus with other specified complication: Secondary | ICD-10-CM | POA: Diagnosis not present

## 2022-03-20 DIAGNOSIS — H02831 Dermatochalasis of right upper eyelid: Secondary | ICD-10-CM | POA: Diagnosis not present

## 2022-03-20 DIAGNOSIS — H52203 Unspecified astigmatism, bilateral: Secondary | ICD-10-CM | POA: Diagnosis not present

## 2022-03-20 DIAGNOSIS — E119 Type 2 diabetes mellitus without complications: Secondary | ICD-10-CM | POA: Diagnosis not present

## 2022-03-20 DIAGNOSIS — H2513 Age-related nuclear cataract, bilateral: Secondary | ICD-10-CM | POA: Diagnosis not present

## 2022-04-15 DIAGNOSIS — K136 Irritative hyperplasia of oral mucosa: Secondary | ICD-10-CM | POA: Diagnosis not present

## 2022-05-12 DIAGNOSIS — E1169 Type 2 diabetes mellitus with other specified complication: Secondary | ICD-10-CM | POA: Diagnosis not present

## 2022-05-21 DIAGNOSIS — Z1211 Encounter for screening for malignant neoplasm of colon: Secondary | ICD-10-CM | POA: Diagnosis not present

## 2022-05-21 DIAGNOSIS — K635 Polyp of colon: Secondary | ICD-10-CM | POA: Diagnosis not present

## 2022-05-21 DIAGNOSIS — K6289 Other specified diseases of anus and rectum: Secondary | ICD-10-CM | POA: Diagnosis not present

## 2022-05-21 DIAGNOSIS — K625 Hemorrhage of anus and rectum: Secondary | ICD-10-CM | POA: Diagnosis not present

## 2022-05-21 DIAGNOSIS — K648 Other hemorrhoids: Secondary | ICD-10-CM | POA: Diagnosis not present

## 2022-07-10 DIAGNOSIS — Z1231 Encounter for screening mammogram for malignant neoplasm of breast: Secondary | ICD-10-CM | POA: Diagnosis not present

## 2022-08-20 ENCOUNTER — Other Ambulatory Visit: Payer: Self-pay | Admitting: Obstetrics and Gynecology

## 2022-08-20 DIAGNOSIS — Z803 Family history of malignant neoplasm of breast: Secondary | ICD-10-CM

## 2022-08-26 ENCOUNTER — Ambulatory Visit (INDEPENDENT_AMBULATORY_CARE_PROVIDER_SITE_OTHER): Payer: BC Managed Care – PPO | Admitting: Podiatry

## 2022-08-26 DIAGNOSIS — M7661 Achilles tendinitis, right leg: Secondary | ICD-10-CM | POA: Diagnosis not present

## 2022-08-26 DIAGNOSIS — M722 Plantar fascial fibromatosis: Secondary | ICD-10-CM

## 2022-08-26 NOTE — Progress Notes (Signed)
Subjective:  Patient ID: [name], female    DOB: 12-Feb-1961,  MRN: [id number]  Chief Complaint  Patient presents with   Diabetes    Pt stated that she is a type 2 diabetic and she is not sure if her foot pain is coming from that or if it is something more she has some burning sensations.     61 y.o. female presents with the above complaint.  Patient presents with left Planter fasciitis.  Patient states pain for touch is progressive gotten worse worse with ambulation worse with pressure pain scale 7 out of 10 sharp shooting in nature.  She has secondary complaint of right posterior Achilles tendinitis likely due to compensate Tory mechanism.  She states that pain scale is about 5 out of 10 and may have started after the plantar fasciitis to the left heel.  She has not seen MRIs prior to seeing me she denies any other acute complaints.   Review of Systems: Negative except as noted in the HPI. Denies N/V/F/Ch.  Past Medical History:  Diagnosis Date   Difficult intubation 2010   History of kidney stones    PONV (postoperative nausea and vomiting)    not with 2010 surgery   Pre-diabetes     Current Outpatient Medications:    HYDROcodone-acetaminophen (NORCO/VICODIN) 5-325 MG tablet, Take 1-2 tablets by mouth every 6 (six) hours as needed for moderate pain., Disp: 15 tablet, Rfl: 0   MAGNESIUM PO, Take 1 tablet by mouth daily., Disp: , Rfl:    Omega-3 Fatty Acids (FISH OIL) 500 MG CAPS, Take 1,500 mg by mouth daily., Disp: , Rfl:    Probiotic Product (PROBIOTIC PO), Take 1 tablet by mouth daily., Disp: , Rfl:    selenium 50 MCG TABS tablet, Take 50 mcg by mouth daily., Disp: , Rfl:    TURMERIC PO, Take 1 tablet by mouth daily., Disp: , Rfl:   Social History   Tobacco Use  Smoking Status Never  Smokeless Tobacco Never    No Known Allergies Objective:  There were no vitals filed for this visit. There is no height or weight on file to calculate BMI. Constitutional Well  developed. Well nourished.  Vascular Dorsalis pedis pulses palpable bilaterally. Posterior tibial pulses palpable bilaterally. Capillary refill normal to all digits.  No cyanosis or clubbing noted. Pedal hair growth normal.  Neurologic Normal speech. Oriented to person, place, and time. Epicritic sensation to light touch grossly present bilaterally.  Dermatologic Nails well groomed and normal in appearance. No open wounds. No skin lesions.  Orthopedic: Normal joint ROM without pain or crepitus bilaterally. No visible deformities. Tender to palpation at the calcaneal tuber left. No pain with calcaneal squeeze left. Ankle ROM diminished range of motion left. Silfverskiold Test: positive left.  Pain on palpation right Achilles tendon insertion.  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  Pain on palpation.   Radiographs: None  Assessment:   1. Plantar fasciitis of left foot   2. Right Achilles tendinitis    Plan:  Patient was evaluated and treated and all questions answered.  Right Achilles tendinitis I explained patient the etiology of tendinitis and various treatment options were discussed.  Given the amount of pain that she is experienced she will benefit from a cam boot immobilization to allow the soft tissue structure to heal appropriately.  Patient agrees with plan like to proceed with cam boot immobilization -Cam boot was dispensed.  Plantar Fasciitis, left - XR reviewed as above.  - Educated on icing and stretching. Instructions given.  - Injection delivered to the plantar fascia as below. - DME: Plantar fascial brace dispensed to support the medial longitudinal arch of the foot and offload pressure from the heel and prevent arch collapse during weightbearing - Pharmacologic management: None  Procedure: Injection Tendon/Ligament Location: Left plantar fascia at the glabrous junction; medial  approach. Skin Prep: alcohol Injectate: 0.5 cc 0.5% marcaine plain, 0.5 cc of 1% Lidocaine, 0.5 cc kenalog 10. Disposition: Patient tolerated procedure well. Injection site dressed with a band-aid.

## 2022-09-07 ENCOUNTER — Ambulatory Visit
Admission: RE | Admit: 2022-09-07 | Discharge: 2022-09-07 | Disposition: A | Discharge status: Home / Self Care | Payer: BC Managed Care – PPO | Source: Ambulatory Visit | Attending: Obstetrics and Gynecology | Admitting: Obstetrics and Gynecology

## 2022-09-07 DIAGNOSIS — Z803 Family history of malignant neoplasm of breast: Secondary | ICD-10-CM

## 2022-09-07 DIAGNOSIS — N6489 Other specified disorders of breast: Secondary | ICD-10-CM | POA: Diagnosis not present

## 2022-09-07 MED ORDER — GADOPICLENOL 0.5 MMOL/ML IV SOLN
7.5000 mL | Freq: Once | INTRAVENOUS | Status: AC | PRN
  Administered 2022-09-07: 7.5 mL via INTRAVENOUS

## 2022-09-15 ENCOUNTER — Telehealth: Payer: Self-pay | Admitting: *Deleted

## 2022-09-15 NOTE — Patient Instructions (Signed)

## 2022-09-15 NOTE — Telephone Encounter (Signed)
Patient is calling to request stretching exercises mentioned during office visit on 08/26/22, was not listed in after visit summary given,please advise.

## 2022-09-16 NOTE — Telephone Encounter (Signed)
Sent to patient thru Mychart per her request

## 2022-09-23 ENCOUNTER — Ambulatory Visit (INDEPENDENT_AMBULATORY_CARE_PROVIDER_SITE_OTHER): Payer: BC Managed Care – PPO | Admitting: Podiatry

## 2022-09-23 DIAGNOSIS — Q667 Congenital pes cavus, unspecified foot: Secondary | ICD-10-CM | POA: Diagnosis not present

## 2022-09-23 DIAGNOSIS — M722 Plantar fascial fibromatosis: Secondary | ICD-10-CM

## 2022-09-23 DIAGNOSIS — M7661 Achilles tendinitis, right leg: Secondary | ICD-10-CM | POA: Diagnosis not present

## 2022-09-23 NOTE — Progress Notes (Signed)
Subjective:  Patient ID: [name], female    DOB: 1961/08/09,  MRN: [id number]  Chief Complaint  Patient presents with   Foot Pain    Right foot tendonitis and left foot plantar fasciitis, patient states she is doing better, rate of pain 4 out of 10, TX Cam Boot, P.F. Brace    61 y.o. female presents with the above complaint.  Patient presents with left Planter fasciitis.  Patient states pain for touch is progressive gotten worse worse with ambulation worse with pressure pain scale 7 out of 10 sharp shooting in nature.  She has secondary complaint of right posterior Achilles tendinitis likely due to compensate Tory mechanism.  She states that pain scale is about 5 out of 10 and may have started after the plantar fasciitis to the left heel.  She has not seen MRIs prior to seeing me she denies any other acute complaints.   Review of Systems: Negative except as noted in the HPI. Denies N/V/F/Ch.  Past Medical History:  Diagnosis Date   Difficult intubation 2010   History of kidney stones    PONV (postoperative nausea and vomiting)    not with 2010 surgery   Pre-diabetes     Current Outpatient Medications:    HYDROcodone-acetaminophen (NORCO/VICODIN) 5-325 MG tablet, Take 1-2 tablets by mouth every 6 (six) hours as needed for moderate pain., Disp: 15 tablet, Rfl: 0   MAGNESIUM PO, Take 1 tablet by mouth daily., Disp: , Rfl:    Omega-3 Fatty Acids (FISH OIL) 500 MG CAPS, Take 1,500 mg by mouth daily., Disp: , Rfl:    Probiotic Product (PROBIOTIC PO), Take 1 tablet by mouth daily., Disp: , Rfl:    selenium 50 MCG TABS tablet, Take 50 mcg by mouth daily., Disp: , Rfl:    TURMERIC PO, Take 1 tablet by mouth daily., Disp: , Rfl:   Social History   Tobacco Use  Smoking Status Never  Smokeless Tobacco Never    No Known Allergies Objective:  There were no vitals filed for this visit. There is no height or weight on file to calculate BMI. Constitutional Well developed. Well  nourished.  Vascular Dorsalis pedis pulses palpable bilaterally. Posterior tibial pulses palpable bilaterally. Capillary refill normal to all digits.  No cyanosis or clubbing noted. Pedal hair growth normal.  Neurologic Normal speech. Oriented to person, place, and time. Epicritic sensation to light touch grossly present bilaterally.  Dermatologic Nails well groomed and normal in appearance. No open wounds. No skin lesions.  Orthopedic: Normal joint ROM without pain or crepitus bilaterally. No visible deformities. Tender to palpation at the calcaneal tuber left. No pain with calcaneal squeeze left. Ankle ROM diminished range of motion left. Silfverskiold Test: positive left.  Pain on palpation right Achilles tendon insertion.  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  Pain on palpation.   Radiographs: None  Assessment:   1. Plantar fasciitis of left foot   2. Right Achilles tendinitis   3. Pes cavus     Plan:  Patient was evaluated and treated and all questions answered.  Right Achilles tendinitis -Clinically the Achilles tendinitis improved therefore she will benefit from transition from cam boot to Tri-Lock ankle brace.  She would like to hold off steroid injection for now we will try again next time if is not getting better.    Pes pes cavus -I explained to patient the etiology of cavus and relationship with  Planter fasciitis and various treatment options were discussed.  Given patient foot structure in the setting of Planter fasciitis I believe patient will benefit from custom-made orthotics to help control the hindfoot motion support the arch of the foot and take the stress away from plantar fascial.  Patient agrees with the plan like to proceed with orthotics -Patient was casted for orthotics with heel lift    Plantar Fasciitis, left - XR reviewed as above.  - Educated on icing and stretching.  Instructions given.  -Second injection delivered to the plantar fascia as below. - DME: Continue plantar fascial brace.  Night splint was dispensed - Pharmacologic management: None  Procedure: Injection Tendon/Ligament Location: Left plantar fascia at the glabrous junction; medial approach. Skin Prep: alcohol Injectate: 0.5 cc 0.5% marcaine plain, 0.5 cc of 1% Lidocaine, 0.5 cc kenalog 10. Disposition: Patient tolerated procedure well. Injection site dressed with a band-aid.   Left Planter fasciitis second injection night splint  Right Achilles tendon transition to Tri-Lock ankle brace corticosteroid  Pes cavus orthotics

## 2022-10-01 DIAGNOSIS — R350 Frequency of micturition: Secondary | ICD-10-CM | POA: Diagnosis not present

## 2022-10-01 DIAGNOSIS — Z79899 Other long term (current) drug therapy: Secondary | ICD-10-CM | POA: Diagnosis not present

## 2022-10-01 DIAGNOSIS — E782 Mixed hyperlipidemia: Secondary | ICD-10-CM | POA: Diagnosis not present

## 2022-10-01 DIAGNOSIS — E1169 Type 2 diabetes mellitus with other specified complication: Secondary | ICD-10-CM | POA: Diagnosis not present

## 2022-10-01 DIAGNOSIS — Z0000 Encounter for general adult medical examination without abnormal findings: Secondary | ICD-10-CM | POA: Diagnosis not present

## 2022-10-23 ENCOUNTER — Ambulatory Visit (INDEPENDENT_AMBULATORY_CARE_PROVIDER_SITE_OTHER): Payer: BC Managed Care – PPO | Admitting: Podiatry

## 2022-10-23 DIAGNOSIS — M722 Plantar fascial fibromatosis: Secondary | ICD-10-CM

## 2022-10-23 DIAGNOSIS — M7661 Achilles tendinitis, right leg: Secondary | ICD-10-CM

## 2022-10-23 NOTE — Progress Notes (Signed)
Subjective:  Patient ID: [name], female    DOB: Jun 21, 1961,  MRN: [id number]  Chief Complaint  Patient presents with   Plantar Fasciitis    61 y.o. female presents with the above complaint.  Patient presents for follow-up of left Planter fasciitis and Achilles tendinitis to the right side.  She states that both of them are just aggravating and annoying but is tends to be much more manageable she has been able to manage the pain.  Denies any other acute complaints   Review of Systems: Negative except as noted in the HPI. Denies N/V/F/Ch.  Past Medical History:  Diagnosis Date   Difficult intubation 2010   History of kidney stones    PONV (postoperative nausea and vomiting)    not with 2010 surgery   Pre-diabetes     Current Outpatient Medications:    HYDROcodone-acetaminophen (NORCO/VICODIN) 5-325 MG tablet, Take 1-2 tablets by mouth every 6 (six) hours as needed for moderate pain., Disp: 15 tablet, Rfl: 0   MAGNESIUM PO, Take 1 tablet by mouth daily., Disp: , Rfl:    Omega-3 Fatty Acids (FISH OIL) 500 MG CAPS, Take 1,500 mg by mouth daily., Disp: , Rfl:    Probiotic Product (PROBIOTIC PO), Take 1 tablet by mouth daily., Disp: , Rfl:    selenium 50 MCG TABS tablet, Take 50 mcg by mouth daily., Disp: , Rfl:    TURMERIC PO, Take 1 tablet by mouth daily., Disp: , Rfl:   Social History   Tobacco Use  Smoking Status Never  Smokeless Tobacco Never    No Known Allergies Objective:  There were no vitals filed for this visit. There is no height or weight on file to calculate BMI. Constitutional Well developed. Well nourished.  Vascular Dorsalis pedis pulses palpable bilaterally. Posterior tibial pulses palpable bilaterally. Capillary refill normal to all digits.  No cyanosis or clubbing noted. Pedal hair growth normal.  Neurologic Normal speech. Oriented to person, place, and time. Epicritic sensation to light touch grossly present bilaterally.  Dermatologic  Nails well groomed and normal in appearance. No open wounds. No skin lesions.  Orthopedic: Normal joint ROM without pain or crepitus bilaterally. No visible deformities. Tender to palpation at the calcaneal tuber left. No pain with calcaneal squeeze left. Ankle ROM diminished range of motion left. Silfverskiold Test: positive left.  Mild pain on palpation right Achilles tendon insertion.  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  Mild pain on palpation.   Radiographs: None  Assessment:   No diagnosis found.   Plan:  Patient was evaluated and treated and all questions answered.  Right Achilles tendinitis -Clinically the Achilles tendinitis improved therefore she will benefit from transition from cam boot to Tri-Lock ankle brace.  She would like to hold off steroid injection for now we will try again next time if is not getting better.    Pes pes cavus -I explained to patient the etiology of cavus and relationship with Planter fasciitis and various treatment options were discussed.  Given patient foot structure in the setting of Planter fasciitis I believe patient will benefit from custom-made orthotics to help control the hindfoot motion support the arch of the foot and take the stress away from plantar fascial.  Patient agrees with the plan like to proceed with orthotics -Patient is awaiting orthotics    Plantar Fasciitis, left -Patient is she is has been clinically managing and seems to be doing well.  At this time I discussed conservative care and continue management.  If any acute issues she will come back and see me.

## 2022-10-29 ENCOUNTER — Telehealth: Payer: Self-pay | Admitting: Podiatry

## 2022-10-29 NOTE — Telephone Encounter (Signed)
Left message on vm for patient to call back to schedule time to pick up orthotics  No balance

## 2022-11-03 ENCOUNTER — Ambulatory Visit (INDEPENDENT_AMBULATORY_CARE_PROVIDER_SITE_OTHER): Payer: BC Managed Care – PPO

## 2022-11-03 DIAGNOSIS — Q667 Congenital pes cavus, unspecified foot: Secondary | ICD-10-CM

## 2022-11-03 DIAGNOSIS — M722 Plantar fascial fibromatosis: Secondary | ICD-10-CM

## 2022-11-03 NOTE — Progress Notes (Signed)
Patient presents today to pick up custom molded foot orthotics recommended by Dr. Posey Pronto.   Orthotics were dispensed and fit was satisfactory. Reviewed instructions for break-in and wear. Written instructions given to patient.  Patient will follow up as needed.   [name] - order # [id number]

## 2022-11-16 DIAGNOSIS — Z1151 Encounter for screening for human papillomavirus (HPV): Secondary | ICD-10-CM | POA: Diagnosis not present

## 2022-11-16 DIAGNOSIS — Z01419 Encounter for gynecological examination (general) (routine) without abnormal findings: Secondary | ICD-10-CM | POA: Diagnosis not present

## 2022-11-16 DIAGNOSIS — Z6830 Body mass index (BMI) 30.0-30.9, adult: Secondary | ICD-10-CM | POA: Diagnosis not present

## 2022-11-16 DIAGNOSIS — Z1272 Encounter for screening for malignant neoplasm of vagina: Secondary | ICD-10-CM | POA: Diagnosis not present

## 2022-11-19 ENCOUNTER — Other Ambulatory Visit (HOSPITAL_COMMUNITY): Payer: Self-pay | Admitting: Obstetrics and Gynecology

## 2022-11-19 DIAGNOSIS — R011 Cardiac murmur, unspecified: Secondary | ICD-10-CM

## 2022-12-14 ENCOUNTER — Ambulatory Visit (HOSPITAL_COMMUNITY)
Admission: RE | Admit: 2022-12-14 | Discharge: 2022-12-14 | Disposition: A | Discharge status: Home / Self Care | Payer: BC Managed Care – PPO | Source: Ambulatory Visit | Attending: Obstetrics and Gynecology | Admitting: Obstetrics and Gynecology

## 2022-12-14 DIAGNOSIS — R011 Cardiac murmur, unspecified: Secondary | ICD-10-CM | POA: Diagnosis not present

## 2022-12-14 LAB — ECHOCARDIOGRAM COMPLETE
AR max vel: 1.62 cm2
AV Area VTI: 1.55 cm2
AV Area mean vel: 1.6 cm2
AV Mean grad: 8 mmHg
AV Peak grad: 15.2 mmHg
Ao pk vel: 1.95 m/s
Area-P 1/2: 3.1 cm2
Calc EF: 69.1 %
S' Lateral: 2.2 cm
Single Plane A2C EF: 65.6 %
Single Plane A4C EF: 76.5 %

## 2022-12-14 NOTE — Progress Notes (Signed)
  Echocardiogram 2D Echocardiogram has been performed.  [name] 12/14/2022, 3:43 PM

## 2023-03-24 DIAGNOSIS — H52203 Unspecified astigmatism, bilateral: Secondary | ICD-10-CM | POA: Diagnosis not present

## 2023-03-24 DIAGNOSIS — H5203 Hypermetropia, bilateral: Secondary | ICD-10-CM | POA: Diagnosis not present

## 2023-03-24 DIAGNOSIS — E119 Type 2 diabetes mellitus without complications: Secondary | ICD-10-CM | POA: Diagnosis not present

## 2023-03-31 DIAGNOSIS — E669 Obesity, unspecified: Secondary | ICD-10-CM | POA: Diagnosis not present

## 2023-03-31 DIAGNOSIS — Z6830 Body mass index (BMI) 30.0-30.9, adult: Secondary | ICD-10-CM | POA: Diagnosis not present

## 2023-03-31 DIAGNOSIS — E782 Mixed hyperlipidemia: Secondary | ICD-10-CM | POA: Diagnosis not present

## 2023-03-31 DIAGNOSIS — E1169 Type 2 diabetes mellitus with other specified complication: Secondary | ICD-10-CM | POA: Diagnosis not present

## 2023-05-20 DIAGNOSIS — M6740 Ganglion, unspecified site: Secondary | ICD-10-CM | POA: Diagnosis not present

## 2023-08-19 DIAGNOSIS — Z1231 Encounter for screening mammogram for malignant neoplasm of breast: Secondary | ICD-10-CM | POA: Diagnosis not present

## 2023-08-23 ENCOUNTER — Other Ambulatory Visit: Payer: Self-pay | Admitting: Obstetrics and Gynecology

## 2023-08-23 DIAGNOSIS — R928 Other abnormal and inconclusive findings on diagnostic imaging of breast: Secondary | ICD-10-CM

## 2023-09-01 ENCOUNTER — Ambulatory Visit
Admission: RE | Admit: 2023-09-01 | Discharge: 2023-09-01 | Disposition: A | Discharge status: Home / Self Care | Payer: BC Managed Care – PPO | Source: Ambulatory Visit | Attending: Obstetrics and Gynecology | Admitting: Obstetrics and Gynecology

## 2023-09-01 ENCOUNTER — Ambulatory Visit: Payer: BC Managed Care – PPO

## 2023-09-01 DIAGNOSIS — R928 Other abnormal and inconclusive findings on diagnostic imaging of breast: Secondary | ICD-10-CM | POA: Diagnosis not present

## 2023-09-06 ENCOUNTER — Other Ambulatory Visit: Payer: Self-pay | Admitting: Obstetrics and Gynecology

## 2023-09-06 DIAGNOSIS — Z803 Family history of malignant neoplasm of breast: Secondary | ICD-10-CM

## 2023-10-06 ENCOUNTER — Encounter: Payer: Self-pay | Admitting: Podiatry

## 2023-10-06 ENCOUNTER — Ambulatory Visit (INDEPENDENT_AMBULATORY_CARE_PROVIDER_SITE_OTHER): Payer: BC Managed Care – PPO | Admitting: Podiatry

## 2023-10-06 DIAGNOSIS — Q6671 Congenital pes cavus, right foot: Secondary | ICD-10-CM | POA: Diagnosis not present

## 2023-10-06 DIAGNOSIS — Q667 Congenital pes cavus, unspecified foot: Secondary | ICD-10-CM

## 2023-10-06 DIAGNOSIS — Q6672 Congenital pes cavus, left foot: Secondary | ICD-10-CM | POA: Diagnosis not present

## 2023-10-06 NOTE — Progress Notes (Signed)
P  Subjective:  Patient ID: [name], female    DOB: April 28, 1961,  MRN: [id number]  Chief Complaint  Patient presents with   Routine Post Op    PATIENT STATES SHE NEEDS NEW ORTHOTICS, PATIENT STATES EVERYTHING HAS BEEN OK SINCE LAST VISIT.    62 y.o. female presents with the above complaint.  Patient presents with complaint of bilateral pes cavus foot structure.  She states her orthotics try to get worn out.  Overall her feet are doing good she does not have any plantar fasciitis symptoms she occasionally feels a bit overdue much better denies any other acute complaints.   Review of Systems: Negative except as noted in the HPI. Denies N/V/F/Ch.  Past Medical History:  Diagnosis Date   Difficult intubation 2010   History of kidney stones    PONV (postoperative nausea and vomiting)    not with 2010 surgery   Pre-diabetes     Current Outpatient Medications:    MAGNESIUM PO, Take 1 tablet by mouth daily., Disp: , Rfl:    Omega-3 Fatty Acids (FISH OIL) 500 MG CAPS, Take 1,500 mg by mouth daily., Disp: , Rfl:    Probiotic Product (PROBIOTIC PO), Take 1 tablet by mouth daily., Disp: , Rfl:    TURMERIC PO, Take 1 tablet by mouth daily., Disp: , Rfl:    HYDROcodone-acetaminophen (NORCO/VICODIN) 5-325 MG tablet, Take 1-2 tablets by mouth every 6 (six) hours as needed for moderate pain., Disp: 15 tablet, Rfl: 0   selenium 50 MCG TABS tablet, Take 50 mcg by mouth daily., Disp: , Rfl:   Social History   Tobacco Use  Smoking Status Never  Smokeless Tobacco Never    No Known Allergies Objective:  There were no vitals filed for this visit. There is no height or weight on file to calculate BMI. Constitutional Well developed. Well nourished.  Vascular Dorsalis pedis pulses palpable bilaterally. Posterior tibial pulses palpable bilaterally. Capillary refill normal to all digits.  No cyanosis or clubbing noted. Pedal hair growth normal.  Neurologic Normal speech. Oriented to person,  place, and time. Epicritic sensation to light touch grossly present bilaterally.  Dermatologic Nails well groomed and normal in appearance. No open wounds. No skin lesions.  Orthopedic: Bilateral pes planus cavus foot structure noted with calcaneal varus reducible deformity noted, block test.  Prominent heel submet 5 and hallux noted.  No plantar fasciitis pain noted tight plantar fascia noted   Radiographs: None Assessment:   1. Pes cavus    Plan:  Patient was evaluated and treated and all questions answered.  Pes pes cavus -I explained to patient the etiology of cavus and relationship with Planter fasciitis and various treatment options were discussed.  Given patient foot structure in the setting of Planter fasciitis I believe patient will benefit from custom-made orthotics to help control the hindfoot motion support the arch of the foot and take the stress away from plantar fascial.  Patient agrees with the plan like to proceed with orthotics -Patient was casted for orhtotics -  No follow-ups on file.

## 2023-10-08 DIAGNOSIS — Z79899 Other long term (current) drug therapy: Secondary | ICD-10-CM | POA: Diagnosis not present

## 2023-10-08 DIAGNOSIS — E782 Mixed hyperlipidemia: Secondary | ICD-10-CM | POA: Diagnosis not present

## 2023-10-08 DIAGNOSIS — Z0000 Encounter for general adult medical examination without abnormal findings: Secondary | ICD-10-CM | POA: Diagnosis not present

## 2023-10-08 DIAGNOSIS — E1169 Type 2 diabetes mellitus with other specified complication: Secondary | ICD-10-CM | POA: Diagnosis not present

## 2023-10-08 DIAGNOSIS — E669 Obesity, unspecified: Secondary | ICD-10-CM | POA: Diagnosis not present

## 2023-10-19 NOTE — Progress Notes (Signed)
Orthotic order placed will call when in for fitting appt  Nicki [place]

## 2023-10-22 ENCOUNTER — Ambulatory Visit
Admission: RE | Admit: 2023-10-22 | Discharge: 2023-10-22 | Disposition: A | Discharge status: Home / Self Care | Payer: BC Managed Care – PPO | Source: Ambulatory Visit | Attending: Obstetrics and Gynecology | Admitting: Obstetrics and Gynecology

## 2023-10-22 DIAGNOSIS — Z1239 Encounter for other screening for malignant neoplasm of breast: Secondary | ICD-10-CM | POA: Diagnosis not present

## 2023-10-22 DIAGNOSIS — Z803 Family history of malignant neoplasm of breast: Secondary | ICD-10-CM

## 2023-10-22 MED ORDER — GADOPICLENOL 0.5 MMOL/ML IV SOLN
8.0000 mL | Freq: Once | INTRAVENOUS | Status: AC | PRN
  Administered 2023-10-22: 8 mL via INTRAVENOUS

## 2023-11-24 DIAGNOSIS — Z01419 Encounter for gynecological examination (general) (routine) without abnormal findings: Secondary | ICD-10-CM | POA: Diagnosis not present

## 2023-11-24 DIAGNOSIS — Z1272 Encounter for screening for malignant neoplasm of vagina: Secondary | ICD-10-CM | POA: Diagnosis not present

## 2023-11-24 DIAGNOSIS — Z6830 Body mass index (BMI) 30.0-30.9, adult: Secondary | ICD-10-CM | POA: Diagnosis not present

## 2023-11-24 DIAGNOSIS — Z1151 Encounter for screening for human papillomavirus (HPV): Secondary | ICD-10-CM | POA: Diagnosis not present

## 2023-12-02 ENCOUNTER — Ambulatory Visit: Payer: BC Managed Care – PPO

## 2023-12-02 NOTE — Progress Notes (Signed)
Patient presents today to pick up custom molded foot orthotics, diagnosed with Pes Cavus by Dr. Allena Katz.   Orthotics were dispensed and fit was satisfactory. Reviewed instructions for break-in and wear. Written instructions given to patient.  Patient will follow up as needed.   [name] CPed, CFo, CFm

## 2024-04-07 DIAGNOSIS — E669 Obesity, unspecified: Secondary | ICD-10-CM | POA: Diagnosis not present

## 2024-04-07 DIAGNOSIS — K582 Mixed irritable bowel syndrome: Secondary | ICD-10-CM | POA: Diagnosis not present

## 2024-04-07 DIAGNOSIS — E1169 Type 2 diabetes mellitus with other specified complication: Secondary | ICD-10-CM | POA: Diagnosis not present

## 2024-07-26 DIAGNOSIS — E119 Type 2 diabetes mellitus without complications: Secondary | ICD-10-CM | POA: Diagnosis not present

## 2024-08-02 ENCOUNTER — Ambulatory Visit (INDEPENDENT_AMBULATORY_CARE_PROVIDER_SITE_OTHER)

## 2024-08-02 DIAGNOSIS — M7661 Achilles tendinitis, right leg: Secondary | ICD-10-CM | POA: Diagnosis not present

## 2024-08-02 DIAGNOSIS — M722 Plantar fascial fibromatosis: Secondary | ICD-10-CM | POA: Diagnosis not present

## 2024-08-02 DIAGNOSIS — Q667 Congenital pes cavus, unspecified foot: Secondary | ICD-10-CM

## 2024-08-02 NOTE — Progress Notes (Signed)
 Patient was here to order new orthotics  Patient would like same as last year scans are still on file Email sent to [facility] charges added today patient will be notified when in and can pick up   Lolita Schultze Cped, CFo, CFm  [employer] [place] Deduct and oop met for year

## 2024-08-17 ENCOUNTER — Telehealth: Payer: Self-pay

## 2024-08-17 NOTE — Telephone Encounter (Signed)
 Called patient orthotics are here can pu no appt needed. Charges pending insurance

## 2024-09-11 DIAGNOSIS — Z1231 Encounter for screening mammogram for malignant neoplasm of breast: Secondary | ICD-10-CM | POA: Diagnosis not present

## 2024-09-12 ENCOUNTER — Other Ambulatory Visit: Payer: Self-pay | Admitting: Obstetrics and Gynecology

## 2024-09-12 DIAGNOSIS — Z803 Family history of malignant neoplasm of breast: Secondary | ICD-10-CM

## 2024-10-30 ENCOUNTER — Ambulatory Visit
Admission: RE | Admit: 2024-10-30 | Discharge: 2024-10-30 | Disposition: A | Source: Ambulatory Visit | Attending: Obstetrics and Gynecology | Admitting: Obstetrics and Gynecology

## 2024-10-30 DIAGNOSIS — Z803 Family history of malignant neoplasm of breast: Secondary | ICD-10-CM

## 2024-10-30 MED ORDER — GADOPICLENOL 0.5 MMOL/ML IV SOLN
7.5000 mL | Freq: Once | INTRAVENOUS | Status: AC | PRN
  Administered 2024-10-30: 7.5 mL via INTRAVENOUS

## 2024-11-29 DIAGNOSIS — N83209 Unspecified ovarian cyst, unspecified side: Secondary | ICD-10-CM | POA: Insufficient documentation

## 2024-11-29 DIAGNOSIS — R011 Cardiac murmur, unspecified: Secondary | ICD-10-CM | POA: Insufficient documentation

## 2024-11-29 DIAGNOSIS — Z6830 Body mass index (BMI) 30.0-30.9, adult: Secondary | ICD-10-CM | POA: Insufficient documentation

## 2024-11-29 DIAGNOSIS — E669 Obesity, unspecified: Secondary | ICD-10-CM | POA: Insufficient documentation

## 2024-11-29 DIAGNOSIS — E782 Mixed hyperlipidemia: Secondary | ICD-10-CM | POA: Insufficient documentation

## 2024-11-29 DIAGNOSIS — K582 Mixed irritable bowel syndrome: Secondary | ICD-10-CM | POA: Insufficient documentation

## 2024-11-29 DIAGNOSIS — H9190 Unspecified hearing loss, unspecified ear: Secondary | ICD-10-CM | POA: Insufficient documentation
